# Patient Record
Sex: Female | Born: 1975 | Race: White | Hispanic: No | Marital: Single | Smoking: Never smoker
Health system: Southern US, Community
[De-identification: ages and names within clinical notes are randomized; demographics above are authoritative.]

## PROBLEM LIST (undated history)

## (undated) DIAGNOSIS — F419 Anxiety disorder, unspecified: Secondary | ICD-10-CM

## (undated) DIAGNOSIS — T7840XA Allergy, unspecified, initial encounter: Secondary | ICD-10-CM

## (undated) DIAGNOSIS — K602 Anal fissure, unspecified: Secondary | ICD-10-CM

## (undated) DIAGNOSIS — J45909 Unspecified asthma, uncomplicated: Secondary | ICD-10-CM

## (undated) DIAGNOSIS — F329 Major depressive disorder, single episode, unspecified: Secondary | ICD-10-CM

## (undated) DIAGNOSIS — F32A Depression, unspecified: Secondary | ICD-10-CM

## (undated) HISTORY — DX: Depression, unspecified: F32.A

## (undated) HISTORY — DX: Major depressive disorder, single episode, unspecified: F32.9

## (undated) HISTORY — PX: EYE SURGERY: SHX253

## (undated) HISTORY — PX: UPPER GASTROINTESTINAL ENDOSCOPY: SHX188

## (undated) HISTORY — PX: COLONOSCOPY: SHX174

## (undated) HISTORY — PX: NO PAST SURGERIES: SHX2092

## (undated) HISTORY — DX: Unspecified asthma, uncomplicated: J45.909

## (undated) HISTORY — PX: OTHER SURGICAL HISTORY: SHX169

## (undated) HISTORY — DX: Allergy, unspecified, initial encounter: T78.40XA

## (undated) HISTORY — DX: Anxiety disorder, unspecified: F41.9

## (undated) HISTORY — DX: Anal fissure, unspecified: K60.2

---

## 1998-04-15 ENCOUNTER — Other Ambulatory Visit: Admission: RE | Admit: 1998-04-15 | Discharge: 1998-04-15 | Payer: Self-pay | Admitting: Obstetrics & Gynecology

## 1998-05-05 ENCOUNTER — Other Ambulatory Visit: Admission: RE | Admit: 1998-05-05 | Discharge: 1998-05-05 | Payer: Self-pay | Admitting: Obstetrics & Gynecology

## 1998-12-14 ENCOUNTER — Other Ambulatory Visit: Admission: RE | Admit: 1998-12-14 | Discharge: 1998-12-14 | Payer: Self-pay | Admitting: Obstetrics & Gynecology

## 1999-05-10 ENCOUNTER — Other Ambulatory Visit: Admission: RE | Admit: 1999-05-10 | Discharge: 1999-05-10 | Payer: Self-pay | Admitting: Obstetrics & Gynecology

## 1999-12-26 ENCOUNTER — Other Ambulatory Visit: Admission: RE | Admit: 1999-12-26 | Discharge: 1999-12-26 | Payer: Self-pay | Admitting: Obstetrics & Gynecology

## 2000-06-22 ENCOUNTER — Inpatient Hospital Stay (HOSPITAL_COMMUNITY): Admission: AD | Admit: 2000-06-22 | Discharge: 2000-06-25 | Payer: Self-pay | Admitting: Obstetrics and Gynecology

## 2000-06-26 ENCOUNTER — Encounter: Admission: RE | Admit: 2000-06-26 | Discharge: 2000-07-26 | Payer: Self-pay | Admitting: Obstetrics and Gynecology

## 2000-08-22 ENCOUNTER — Other Ambulatory Visit: Admission: RE | Admit: 2000-08-22 | Discharge: 2000-08-22 | Payer: Self-pay | Admitting: Obstetrics & Gynecology

## 2000-08-26 ENCOUNTER — Encounter: Admission: RE | Admit: 2000-08-26 | Discharge: 2000-09-25 | Payer: Self-pay | Admitting: Obstetrics and Gynecology

## 2000-10-26 ENCOUNTER — Encounter: Admission: RE | Admit: 2000-10-26 | Discharge: 2000-11-25 | Payer: Self-pay | Admitting: Obstetrics and Gynecology

## 2000-11-26 ENCOUNTER — Encounter: Admission: RE | Admit: 2000-11-26 | Discharge: 2000-12-26 | Payer: Self-pay | Admitting: Obstetrics and Gynecology

## 2001-01-26 ENCOUNTER — Encounter: Admission: RE | Admit: 2001-01-26 | Discharge: 2001-02-25 | Payer: Self-pay | Admitting: Obstetrics and Gynecology

## 2001-03-28 ENCOUNTER — Encounter: Admission: RE | Admit: 2001-03-28 | Discharge: 2001-04-27 | Payer: Self-pay | Admitting: Obstetrics and Gynecology

## 2001-04-28 ENCOUNTER — Encounter: Admission: RE | Admit: 2001-04-28 | Discharge: 2001-05-28 | Payer: Self-pay | Admitting: Obstetrics and Gynecology

## 2001-06-26 ENCOUNTER — Encounter: Admission: RE | Admit: 2001-06-26 | Discharge: 2001-07-26 | Payer: Self-pay | Admitting: Obstetrics and Gynecology

## 2001-08-23 ENCOUNTER — Other Ambulatory Visit: Admission: RE | Admit: 2001-08-23 | Discharge: 2001-08-23 | Payer: Self-pay | Admitting: Obstetrics and Gynecology

## 2003-06-18 ENCOUNTER — Other Ambulatory Visit: Admission: RE | Admit: 2003-06-18 | Discharge: 2003-06-18 | Payer: Self-pay | Admitting: Obstetrics & Gynecology

## 2004-07-19 ENCOUNTER — Other Ambulatory Visit: Admission: RE | Admit: 2004-07-19 | Discharge: 2004-07-19 | Payer: Self-pay | Admitting: Obstetrics & Gynecology

## 2012-02-13 ENCOUNTER — Other Ambulatory Visit: Payer: Self-pay | Admitting: Obstetrics & Gynecology

## 2012-02-13 DIAGNOSIS — R928 Other abnormal and inconclusive findings on diagnostic imaging of breast: Secondary | ICD-10-CM

## 2012-02-21 ENCOUNTER — Ambulatory Visit
Admission: RE | Admit: 2012-02-21 | Discharge: 2012-02-21 | Disposition: A | Discharge status: Home / Self Care | Payer: BC Managed Care – PPO | Source: Ambulatory Visit | Attending: Obstetrics & Gynecology | Admitting: Obstetrics & Gynecology

## 2012-02-21 DIAGNOSIS — R928 Other abnormal and inconclusive findings on diagnostic imaging of breast: Secondary | ICD-10-CM

## 2012-05-24 ENCOUNTER — Emergency Department (HOSPITAL_BASED_OUTPATIENT_CLINIC_OR_DEPARTMENT_OTHER)
Admission: EM | Admit: 2012-05-24 | Discharge: 2012-05-24 | Disposition: A | Discharge status: Home / Self Care | Payer: BC Managed Care – PPO | Attending: Emergency Medicine | Admitting: Emergency Medicine

## 2012-05-24 ENCOUNTER — Encounter (HOSPITAL_BASED_OUTPATIENT_CLINIC_OR_DEPARTMENT_OTHER): Payer: Self-pay | Admitting: Emergency Medicine

## 2012-05-24 DIAGNOSIS — R197 Diarrhea, unspecified: Secondary | ICD-10-CM | POA: Insufficient documentation

## 2012-05-24 DIAGNOSIS — R42 Dizziness and giddiness: Secondary | ICD-10-CM | POA: Insufficient documentation

## 2012-05-24 DIAGNOSIS — A088 Other specified intestinal infections: Secondary | ICD-10-CM | POA: Insufficient documentation

## 2012-05-24 DIAGNOSIS — Z3202 Encounter for pregnancy test, result negative: Secondary | ICD-10-CM | POA: Insufficient documentation

## 2012-05-24 DIAGNOSIS — A084 Viral intestinal infection, unspecified: Secondary | ICD-10-CM

## 2012-05-24 LAB — URINALYSIS, ROUTINE W REFLEX MICROSCOPIC
Glucose, UA: NEGATIVE mg/dL
Hgb urine dipstick: NEGATIVE
Ketones, ur: 15 mg/dL — AB
Specific Gravity, Urine: 1.028 (ref 1.005–1.030)
pH: 5.5 (ref 5.0–8.0)

## 2012-05-24 LAB — PREGNANCY, URINE: Preg Test, Ur: NEGATIVE

## 2012-05-24 MED ORDER — ONDANSETRON 8 MG PO TBDP
8.0000 mg | ORAL_TABLET | Freq: Once | ORAL | Status: AC
  Administered 2012-05-24: 8 mg via ORAL
  Filled 2012-05-24: qty 1

## 2012-05-24 MED ORDER — ONDANSETRON 4 MG PO TBDP: 4.0000 mg | ORAL_TABLET | Freq: Four times a day (QID) | ORAL | Status: DC | PRN

## 2012-05-24 MED ORDER — LOPERAMIDE HCL 2 MG PO CAPS
4.0000 mg | ORAL_CAPSULE | Freq: Once | ORAL | Status: AC
  Administered 2012-05-24: 4 mg via ORAL
  Filled 2012-05-24: qty 2

## 2012-05-24 MED ORDER — IBUPROFEN 800 MG PO TABS
800.0000 mg | ORAL_TABLET | Freq: Once | ORAL | Status: DC
  Filled 2012-05-24: qty 1

## 2012-05-24 NOTE — ED Notes (Signed)
Pt c/o n/v/d since 9 pm

## 2012-05-24 NOTE — ED Notes (Signed)
MD at bedside. 

## 2012-05-24 NOTE — ED Provider Notes (Signed)
History     CSN: [id number]  Arrival date & time 05/24/12  0138   First MD Initiated Contact with Patient 05/24/12 0149      Chief Complaint  Patient presents with  . Emesis  . Diarrhea    (Consider location/radiation/quality/duration/timing/severity/associated sxs/prior treatment) HPI DIAMONIQUE RUEDAS is a 37 y.o. female presents with extensive nausea vomiting and foul-smelling diarrhea that started at 9:00 earlier last evening. 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 She denies any fevers, chills, chest pain, shortness of breath. She said some mild orthostatic dizziness. She also endorses some abdominal cramping prior to vomiting or prior to diarrhea than that spontaneously resolves. She says her symptoms have been severe, constant, she's not taken anything for them, and no other alleviating or exacerbating factors no other associated symptoms. Patient is currently just completed her menstrual cycle. Denies any dysuria, frequency     History reviewed. No pertinent past medical history.  History reviewed. No pertinent past surgical history.  No family history on file.  History  Substance Use Topics  . Smoking status: Never Smoker   . Smokeless tobacco: Not on file  . Alcohol Use: No    OB History   Grav Para Term Preterm Abortions TAB SAB Ect Mult Living                  Review of Systems At least 10pt or greater review of systems completed and are negative except where specified in the HPI.  Allergies  Review of patient's allergies indicates no known allergies.  Home Medications   Current Outpatient Rx  Name  Route  Sig  Dispense  Refill  . FLUoxetine (PROZAC) 20 MG capsule   Oral   Take 20 mg by mouth daily.           BP 113/69  Pulse 87  Temp(Src) 97.9 F (36.6 C) (Oral)  Resp 18  Ht 5\' 8"  (1.727 m)  Wt 124 lb (56.246 kg)  BMI 18.86 kg/m2  SpO2 100%  Physical Exam  Nursing notes reviewed.   Electronic medical record reviewed. VITAL SIGNS:   Filed Vitals:   05/24/12 0146  BP: 113/69  Pulse: 87  Temp: 97.9 F (36.6 C)  TempSrc: Oral  Resp: 18  Height: 5\' 8"  (1.727 m)  Weight: 124 lb (56.246 kg)  SpO2: 100%   CONSTITUTIONAL: Awake, oriented, appears non-toxic HENT: Atraumatic, normocephalic, oral mucosa pink and moist, airway patent. Nares patent without drainage. External ears normal. EYES: Conjunctiva clear, EOMI, PERRLA NECK: Trachea midline, non-tender, supple CARDIOVASCULAR: Normal heart rate, Normal rhythm, No murmurs, rubs, gallops PULMONARY/CHEST: Clear to auscultation, no rhonchi, wheezes, or rales. Symmetrical breath sounds. Non-tender. ABDOMINAL: Non-distended, soft, non-tender - no rebound or guarding.  BS normal. NEUROLOGIC: Non-focal, moving all four extremities, no gross sensory or motor deficits. EXTREMITIES: No clubbing, cyanosis, or edema SKIN: Warm, Dry, No erythema, No rash  ED Course  Procedures (including critical care time)  Labs Reviewed  URINALYSIS, ROUTINE W REFLEX MICROSCOPIC - Abnormal; Notable for the following:    Color, Urine AMBER (*)    Bilirubin Urine SMALL (*)    Ketones, ur 15 (*)    All other components within normal limits  PREGNANCY, URINE   No results found.   1. Viral gastroenteritis       MDM  ALJEAN HORIUCHI is a 37 y.o. female presents with likely viral gastroenteritis and some mild dehydration. Patient is not amenable  to keep fluids down, give her antiemetics, loperamide to reduce fluid losses.  Encouraged patient to rehydrate orally, vital signs are stable within normal limits - do not think IV fluids are indicated at this time.  She has no focal abdominal pain, do not think she's got an acute intra-abdominal emergency at this time, doubt appendicitis, she denies any vaginal discharge, doubt ovarian pathology such as TOA or ovarian torsion. She is pain-free until she has an episode of vomiting or  diarrhea  Patient observe for time, she's feeling better after medication, we'll discharged him stable and good condition with antiemetics, loperamide and acetaminophen versus ibuprofen as needed for discomfort. Patient will return to work in 2 days.  I explained the diagnosis and have given explicit precautions to return to the ER including focal abdominal pain or any other new or worsening symptoms. The patient understands and accepts the medical plan as it's been dictated and I have answered their questions. Discharge instructions concerning home care and prescriptions have been given.  The patient is STABLE and is discharged to home in good condition.           Jones Skene, MD 05/24/12 [phone]

## 2013-01-06 ENCOUNTER — Other Ambulatory Visit (HOSPITAL_COMMUNITY): Payer: Self-pay | Admitting: Gynecology

## 2013-01-06 DIAGNOSIS — Z3141 Encounter for fertility testing: Secondary | ICD-10-CM

## 2013-01-14 ENCOUNTER — Other Ambulatory Visit (HOSPITAL_COMMUNITY): Payer: BC Managed Care – PPO

## 2013-02-11 ENCOUNTER — Encounter (INDEPENDENT_AMBULATORY_CARE_PROVIDER_SITE_OTHER): Payer: Self-pay

## 2013-02-11 ENCOUNTER — Ambulatory Visit (HOSPITAL_COMMUNITY)
Admission: RE | Admit: 2013-02-11 | Discharge: 2013-02-11 | Disposition: A | Discharge status: Home / Self Care | Payer: BC Managed Care – PPO | Source: Ambulatory Visit | Attending: Gynecology | Admitting: Gynecology

## 2013-02-11 DIAGNOSIS — N979 Female infertility, unspecified: Secondary | ICD-10-CM | POA: Insufficient documentation

## 2013-02-11 DIAGNOSIS — Z3141 Encounter for fertility testing: Secondary | ICD-10-CM

## 2013-02-11 MED ORDER — IOHEXOL 300 MG/ML  SOLN
10.0000 mL | Freq: Once | INTRAMUSCULAR | Status: AC | PRN
  Administered 2013-02-11: 10 mL

## 2015-01-01 ENCOUNTER — Ambulatory Visit (INDEPENDENT_AMBULATORY_CARE_PROVIDER_SITE_OTHER): Payer: BLUE CROSS/BLUE SHIELD | Admitting: Internal Medicine

## 2015-01-01 ENCOUNTER — Encounter: Payer: Self-pay | Admitting: Internal Medicine

## 2015-01-01 VITALS — BP 90/58 | HR 77 | Temp 98.2°F | Resp 14 | Ht 69.5 in | Wt 120.8 lb

## 2015-01-01 DIAGNOSIS — T781XXA Other adverse food reactions, not elsewhere classified, initial encounter: Secondary | ICD-10-CM | POA: Insufficient documentation

## 2015-01-01 DIAGNOSIS — R0602 Shortness of breath: Secondary | ICD-10-CM | POA: Insufficient documentation

## 2015-01-01 DIAGNOSIS — J301 Allergic rhinitis due to pollen: Secondary | ICD-10-CM | POA: Diagnosis not present

## 2015-01-01 MED ORDER — FLUNISOLIDE 25 MCG/ACT (0.025%) NA SOLN: 2.0000 | Freq: Two times a day (BID) | NASAL | Status: DC

## 2015-01-01 MED ORDER — ALBUTEROL SULFATE (2.5 MG/3ML) 0.083% IN NEBU
2.5000 mg | INHALATION_SOLUTION | Freq: Once | RESPIRATORY_TRACT | Status: AC
  Administered 2015-01-01: 2.5 mg via RESPIRATORY_TRACT

## 2015-01-01 MED ORDER — ALBUTEROL SULFATE 108 (90 BASE) MCG/ACT IN AEPB: 2.0000 | INHALATION_SPRAY | RESPIRATORY_TRACT | Status: DC | PRN

## 2015-01-01 NOTE — Assessment & Plan Note (Addendum)
   Allergic to grass, tree, cat, dog, cockroach, dust mite. Handouts given.  Start cetirizine 1 tablet daily.  If no improvement may add flunisolide 1 spray each nostril twice a day.  Consider allergen immunotherapy. She would be a good candidate.

## 2015-01-01 NOTE — Assessment & Plan Note (Signed)
   Handout given. Avoid the foods that cause her mouth to itch and burn.

## 2015-01-01 NOTE — Assessment & Plan Note (Addendum)
   Start pro-air repiclick as needed.  Inhaler technique reviewed.

## 2015-01-01 NOTE — Progress Notes (Signed)
01/01/2015  Alexandra Barrett [birth year] [id number]  Referring provider: Freddy FinnerW Ronald Neal, MD [address] [place], [place] [id number]  Chief Complaint: Facial Swelling and Nasal Congestion   Alexandra Barrett is a 39 y.o. female who is being seen today for evaluation of possible food allergy, rhinitis, shortness of breath.  HPI Comments: Possible food allergy: Patient has been having increasing problems as she has gotten older. She has eaten banana nut muffins containing walnuts and immediately developed a burning sensation in her mouth that resolved fairly quickly. She did not have any associated respiratory GI or cutaneous involvement. She had tolerated tree nuts in the past without any problems. Large amounts of ice cream also cause nausea and bloating but she can tolerate cheese and yogurt without problems. She has tried lactase which initially helped but now does not seem to be working.  Rhinitis: Patient has symptoms mainly in the spring and fall. She has not had repeated sinus infections requiring antibiotics. She has tried Benadryl which did help but caused excessive sedation so she stopped. She has also tried Claritin but is not sure if it helped.  Exercise-induced bronchospasm: Patient has symptoms with strenuous exercise and also with exposure to hot air. She required prednisone on one occasion over 10 years ago. At that time a chest x-ray demonstrated inflammation. She has never had pneumonia; she has not had any emergency room visits or hospitalizations. She denies nocturnal awakening due to shortness of breath and persistent coughing. She has albuterol which she requires rarely.   PMH:  Past Medical History  Diagnosis Date  . Depression   . Anxiety      PSH: History reviewed. No pertinent past surgical history.  Environmental/Social history: She lives in a townhouse that is [address] years of age, there is a feather pillow and comforter, wood flooring, central air  conditioning and heating, cat, no basement in the home, there are family members who smoke outdoors. She is a nonsmoker. She works as a Social workernanny.  Medications:  Current outpatient prescriptions:  .  FLUoxetine (PROZAC) 20 MG capsule, Take 20 mg by mouth daily., Disp: , Rfl:  .  Lactase (DAIRY DIGEST EXTRA PO), Take by mouth 3 (three) times daily., Disp: , Rfl:  .  Albuterol Sulfate (PROAIR RESPICLICK) 108 (90 BASE) MCG/ACT AEPB, Inhale 2 puffs into the lungs every 4 (four) hours as needed., Disp: 1 each, Rfl: 2 .  flunisolide (NASALIDE) 25 MCG/ACT (0.025%) SOLN, Place 2 sprays into the nose 2 (two) times daily., Disp: 1 Bottle, Rfl: 5  FH: Family History  Problem Relation Age of Onset  . Emphysema Maternal Grandfather   . COPD Maternal Grandfather   . Asthma Mother   . Allergic rhinitis Mother     ROS: Per HPI unless specifically indicated below Review of Systems  Constitutional: Negative for fever, chills, appetite change and unexpected weight change.  HENT: Positive for congestion, ear pain, postnasal drip, rhinorrhea, sinus pressure and sneezing. Negative for sore throat.   Eyes: Positive for itching. Negative for pain.  Respiratory: Positive for shortness of breath. Negative for cough, chest tightness and wheezing.   Cardiovascular: Negative for chest pain and leg swelling.  Gastrointestinal: Positive for nausea, constipation and abdominal distention. Negative for vomiting and diarrhea.  Genitourinary: Negative for difficulty urinating.  Musculoskeletal: Negative for joint swelling and arthralgias.  Skin: Negative for rash.  Allergic/Immunologic: Positive for environmental allergies and food allergies. Negative for immunocompromised state.       Wasp  sting - no problems No latex allergy   Neurological: Negative for seizures.    Drug Allergies:  No Known Allergies  Physical Exam: BP 90/58 mmHg  Pulse 77  Temp(Src) 98.2 F (36.8 C) (Oral)  Resp 14  Ht 5' 9.5" (1.765 m)   Wt 120 lb 12.8 oz (54.795 kg)  BMI 17.59 kg/m2  Physical Exam  Constitutional: She appears well-developed and well-nourished. No distress.  HENT:  Right Ear: External ear normal.  Left Ear: External ear normal.  Nose: Nose normal.  Mouth/Throat: Oropharynx is clear and moist.  Eyes: Conjunctivae are normal. Right eye exhibits no discharge. Left eye exhibits no discharge.  Cardiovascular: Normal rate, regular rhythm and normal heart sounds.   No murmur heard. Pulmonary/Chest: Effort normal and breath sounds normal. No respiratory distress. She has no wheezes. She has no rales.  Abdominal: Soft. Bowel sounds are normal.  Musculoskeletal: She exhibits no edema.  Lymphadenopathy:    She has no cervical adenopathy.  Neurological: She is alert.  Skin: No rash noted.  Vitals reviewed.   Diagnostics:   Spirometry: FEV1 75 %, FEV1/FVC  79 % Partially reversible following bronchodilators Spirometry is in the abnormal range - Mild restriction  Aeroallergen skin testing was performed and was positive for: Grass, Tree, Mite, Cat, Dog, Cockroach and Mouse with a good histamine control.   Food allergy skin testing was performed and was negative with a good histamine control.   Skin tests were interpreted by me, transferred into EPIC by CMA, reviewed and accepted by me into EPIC.  Assessment and Plan:  Allergic rhinitis due to pollen  Allergic to grass, tree, cat, dog, cockroach, dust mite. Handouts given.  Start cetirizine 1 tablet daily.  If no improvement may add flunisolide 1 spray each nostril twice a day.  Consider allergen immunotherapy. She would be a good candidate.  Shortness of breath  Start pro-air repiclick as needed.  Inhaler technique reviewed.  Oral allergy syndrome  Handout given. Avoid the foods that cause her mouth to itch and burn.    Return in about 4 weeks (around 01/29/2015).  Thank you for the opportunity to care for this patient.  Please do not  hesitate to contact me with questions.  [facility] [address] [place], [place] [address] [phone]

## 2015-01-01 NOTE — Patient Instructions (Addendum)
Allergic rhinitis due to pollen  Allergic to grass, tree, cat, dog, cockroach, dust mite. Handouts given.  Start cetirizine 1 tablet daily.  If no improvement may add flunisolide 1 spray each nostril twice a day.  Consider allergen immunotherapy. She would be a good candidate.  Shortness of breath  Start pro-air repiclick as needed.  Inhaler technique reviewed.  Oral allergy syndrome  Handout given. Avoid the foods that cause her mouth to itch and burn.             How to Use an Inhaler Proper inhaler technique is very important. Good technique ensures that the medicine reaches the lungs. Poor technique results in depositing the medicine on the tongue and back of the throat rather than in the airways. If you do not use the inhaler with good technique, the medicine will not help you. STEPS TO FOLLOW IF USING AN INHALER WITHOUT AN EXTENSION TUBE 8. Remove the cap from the inhaler. 9. If you are using the inhaler for the first time, you will need to prime it. Shake the inhaler for 5 seconds and release four puffs into the air, away from your face. Ask your health care provider or pharmacist if you have questions about priming your inhaler. 10. Shake the inhaler for 5 seconds before each breath in (inhalation). 11. Position the inhaler so that the top of the canister faces up. 12. Put your index finger on the top of the medicine canister. Your thumb supports the bottom of the inhaler. 13. Open your mouth. 14. Either place the inhaler between your teeth and place your lips tightly around the mouthpiece, or hold the inhaler 1-2 inches away from your open mouth. If you are unsure of which technique to use, ask your health care provider. 15. Breathe out (exhale) normally and as completely as possible. 16. Press the canister down with your index finger to release the medicine. 17. At the same time as the canister is pressed, inhale deeply and slowly until your lungs are completely  filled. This should take 4-6 seconds. Keep your tongue down. 18. Hold the medicine in your lungs for 5-10 seconds (10 seconds is best). This helps the medicine get into the small airways of your lungs. 19. Breathe out slowly, through pursed lips. Whistling is an example of pursed lips. 20. Wait at least 15-30 seconds between puffs. Continue with the above steps until you have taken the number of puffs your health care provider has ordered. Do not use the inhaler more than your health care provider tells you. 21. Replace the cap on the inhaler. 22. Follow the directions from your health care provider or the inhaler insert for cleaning the inhaler. STEPS TO FOLLOW IF USING AN INHALER WITH AN EXTENSION (SPACER) 1. Remove the cap from the inhaler. 2. If you are using the inhaler for the first time, you will need to prime it. Shake the inhaler for 5 seconds and release four puffs into the air, away from your face. Ask your health care provider or pharmacist if you have questions about priming your inhaler. 3. Shake the inhaler for 5 seconds before each breath in (inhalation). 4. Place the open end of the spacer onto the mouthpiece of the inhaler. 5. Position the inhaler so that the top of the canister faces up and the spacer mouthpiece faces you. 6. Put your index finger on the top of the medicine canister. Your thumb supports the bottom of the inhaler and the spacer. 7. Breathe out (exhale)  normally and as completely as possible. 8. Immediately after exhaling, place the spacer between your teeth and into your mouth. Close your lips tightly around the spacer. 9. Press the canister down with your index finger to release the medicine. 10. At the same time as the canister is pressed, inhale deeply and slowly until your lungs are completely filled. This should take 4-6 seconds. Keep your tongue down and out of the way. 11. Hold the medicine in your lungs for 5-10 seconds (10 seconds is best). This helps the  medicine get into the small airways of your lungs. Exhale. 12. Repeat inhaling deeply through the spacer mouthpiece. Again hold that breath for up to 10 seconds (10 seconds is best). Exhale slowly. If it is difficult to take this second deep breath through the spacer, breathe normally several times through the spacer. Remove the spacer from your mouth. 13. Wait at least 15-30 seconds between puffs. Continue with the above steps until you have taken the number of puffs your health care provider has ordered. Do not use the inhaler more than your health care provider tells you. 14. Remove the spacer from the inhaler, and place the cap on the inhaler. 15. Follow the directions from your health care provider or the inhaler insert for cleaning the inhaler and spacer. If you are using different kinds of inhalers, use your quick relief medicine to open the airways 10-15 minutes before using a steroid if instructed to do so by your health care provider. If you are unsure which inhalers to use and the order of using them, ask your health care provider, nurse, or respiratory therapist. If you are using a steroid inhaler, always rinse your mouth with water after your last puff, then gargle and spit out the water. Do not swallow the water. AVOID:  Inhaling before or after starting the spray of medicine. It takes practice to coordinate your breathing with triggering the spray.  Inhaling through the nose (rather than the mouth) when triggering the spray. HOW TO DETERMINE IF YOUR INHALER IS FULL OR NEARLY EMPTY You cannot know when an inhaler is empty by shaking it. A few inhalers are now being made with dose counters. Ask your health care provider for a prescription that has a dose counter if you feel you need that extra help. If your inhaler does not have a counter, ask your health care provider to help you determine the date you need to refill your inhaler. Write the refill date on a calendar or your inhaler  canister. Refill your inhaler 7-10 days before it runs out. Be sure to keep an adequate supply of medicine. This includes making sure it is not expired, and that you have a spare inhaler.  SEEK MEDICAL CARE IF:   Your symptoms are only partially relieved with your inhaler.  You are having trouble using your inhaler.  You have some increase in phlegm. SEEK IMMEDIATE MEDICAL CARE IF:   You feel little or no relief with your inhalers. You are still wheezing and are feeling shortness of breath or tightness in your chest or both.  You have dizziness, headaches, or a fast heart rate.  You have chills, fever, or night sweats.  You have a noticeable increase in phlegm production, or there is blood in the phlegm. MAKE SURE YOU:   Understand these instructions.  Will watch your condition.  Will get help right away if you are not doing well or get worse.   This information is not intended  to replace advice given to you by your health care provider. Make sure you discuss any questions you have with your health care provider.   Document Released: 02/18/2000 Document Revised: 12/11/2012 Document Reviewed: 09/19/2012 Elsevier Interactive Patient Education [employer]

## 2015-03-29 ENCOUNTER — Telehealth: Payer: Self-pay | Admitting: Internal Medicine

## 2015-03-29 NOTE — Telephone Encounter (Signed)
Called pt to explain that the entire amount went to her deductible - she will pay $1000 Friday

## 2015-03-29 NOTE — Telephone Encounter (Signed)
Patient received [facility] bill but said that her insurance company covered expenses so she is wondering if she is still responsible for the bill. Please call back.

## 2017-04-13 ENCOUNTER — Other Ambulatory Visit: Payer: Self-pay | Admitting: Obstetrics & Gynecology

## 2017-04-13 DIAGNOSIS — R928 Other abnormal and inconclusive findings on diagnostic imaging of breast: Secondary | ICD-10-CM

## 2017-04-18 ENCOUNTER — Ambulatory Visit
Admission: RE | Admit: 2017-04-18 | Discharge: 2017-04-18 | Disposition: A | Discharge status: Home / Self Care | Payer: BLUE CROSS/BLUE SHIELD | Source: Ambulatory Visit | Attending: Obstetrics & Gynecology | Admitting: Obstetrics & Gynecology

## 2017-04-18 DIAGNOSIS — R928 Other abnormal and inconclusive findings on diagnostic imaging of breast: Secondary | ICD-10-CM

## 2019-06-09 IMAGING — MG DIGITAL DIAGNOSTIC UNILATERAL RIGHT MAMMOGRAM WITH TOMO AND CAD
8 series · 8 of 24 positions shown · non-contrast
Comparison: Previous exams including recent screening mammogram
dated 04/12/2017.

CLINICAL DATA: Patient returns today to evaluate a possible right
breast asymmetry questioned on recent screening mammogram.

EXAM:
DIGITAL DIAGNOSTIC RIGHT MAMMOGRAM WITH CAD AND TOMO
ULTRASOUND RIGHT BREAST

[R ML synth-2D]
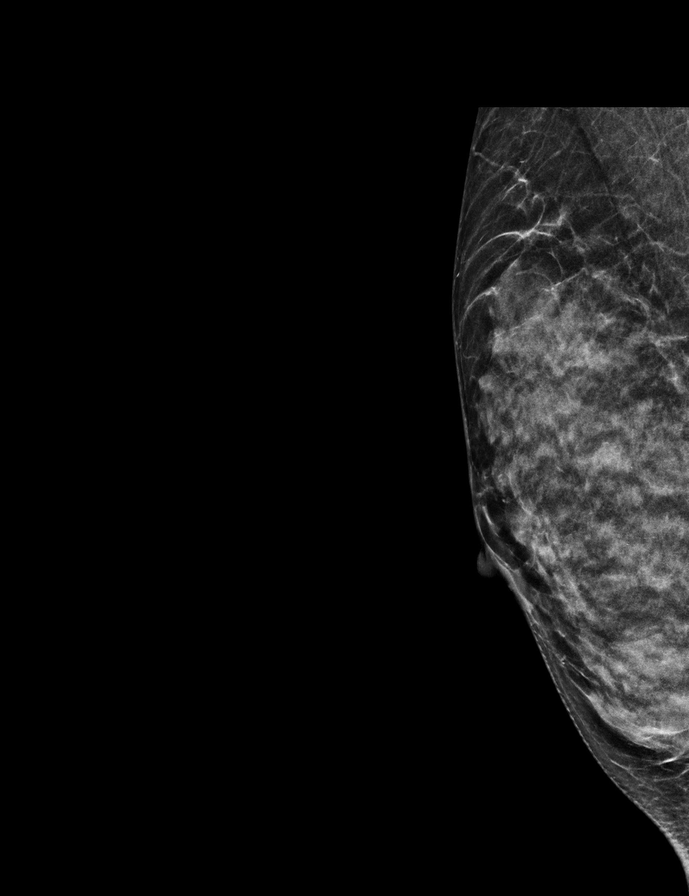

[R MLO synth-2D]
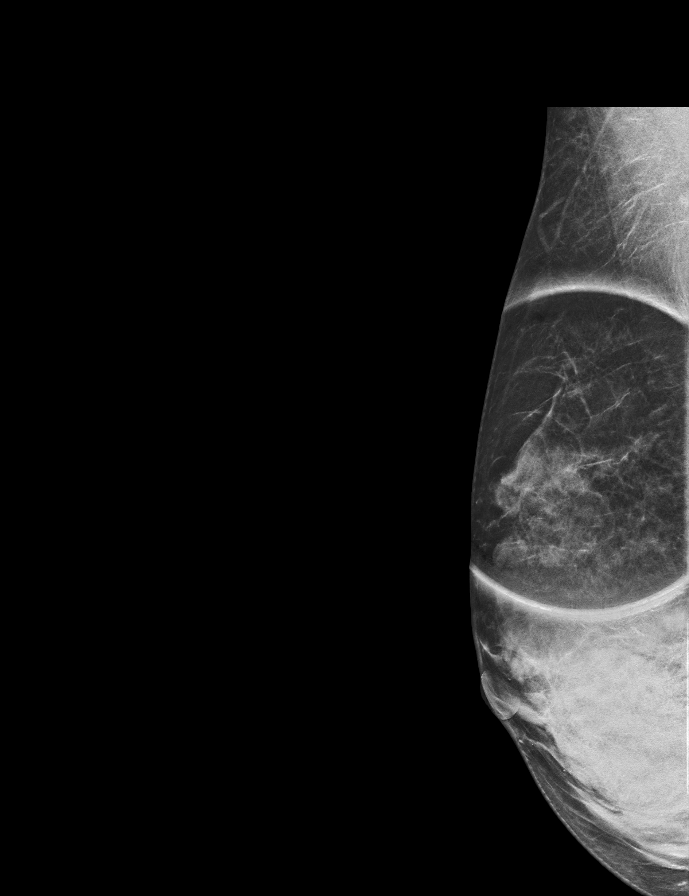

[R CC synth-2D (1 of 2)]
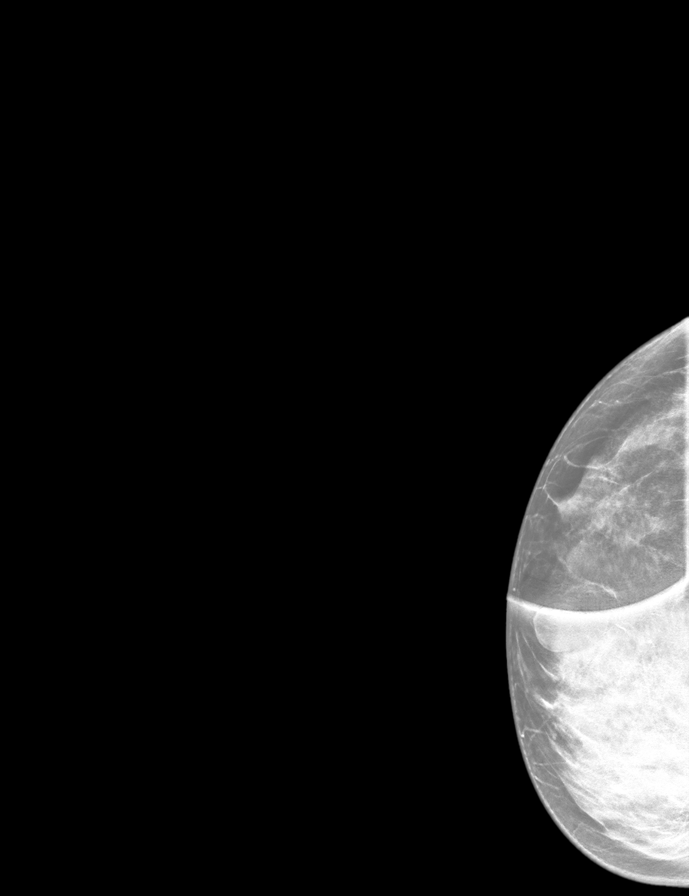

[R CC synth-2D (2 of 2)]
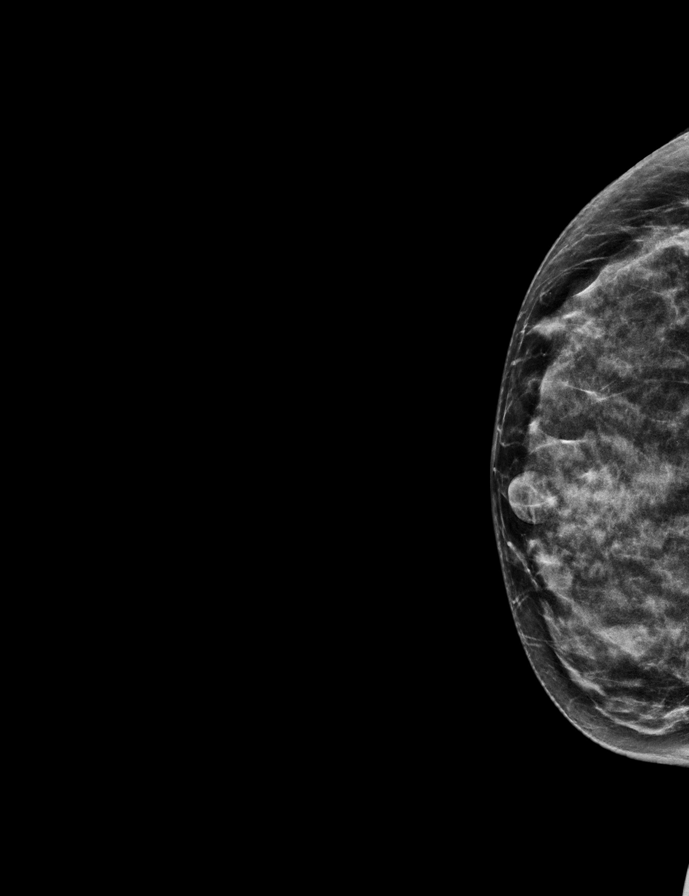

[R CC tomo (1 of 2) · tomo slice 22/43.0]
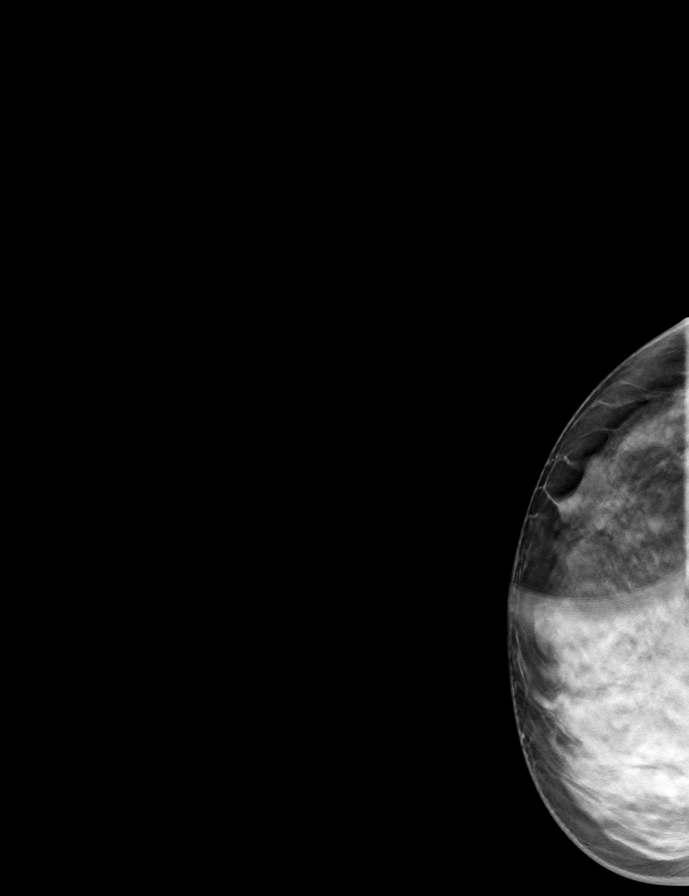

[R ML tomo · tomo slice 23/45.0]
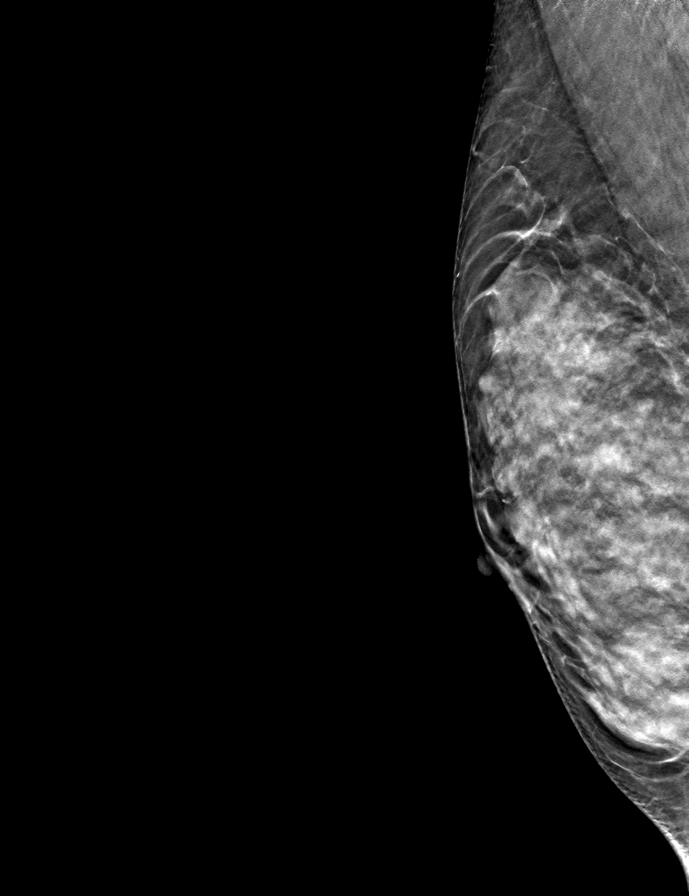

[R CC tomo (2 of 2) · tomo slice 25/48.0]
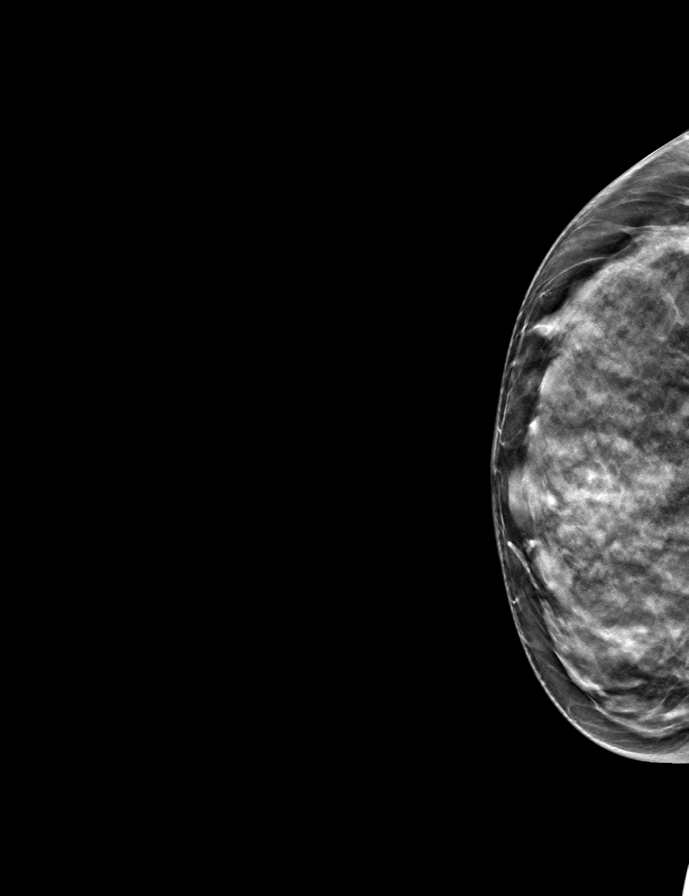

[R MLO tomo · tomo slice 18/35.0]
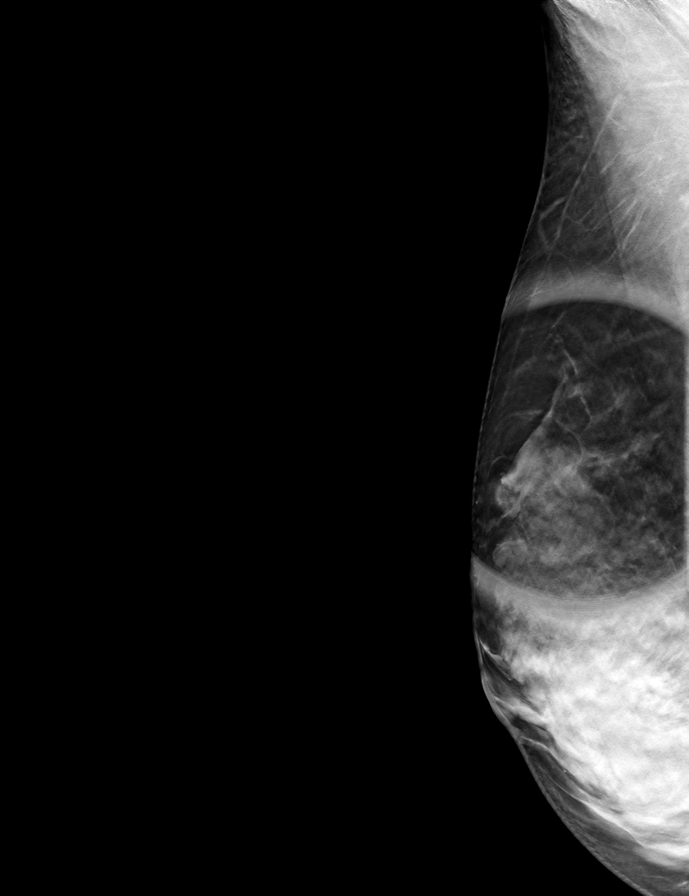

[8 of 24 positions shown; findings below may reference images not displayed]

ACR Breast Density Category c: The breast tissue is heterogeneously
dense, which may obscure small masses.
FINDINGS: On today's additional diagnostic views, including spot compression
with 3D tomosynthesis, the questioned asymmetry within the
upper-outer quadrant of the right breast is most suggestive of
superimposition of normal dense fibroglandular tissues. There are no
dominant masses, suspicious calcifications or secondary signs of
malignancy identified within the right breast on today's exam.

Mammographic images were processed with CAD.

Targeted ultrasound is performed, showing multiple benign cysts
within the upper-outer quadrant of the right breast, 10-11 o'clock
axes, largest measuring 5 mm, most likely incidental findings. There
is no suspicious solid or cystic mass identified within the
upper-outer quadrant of the right breast.
IMPRESSION: No evidence of malignancy. Multiple benign cysts within the upper
outer quadrant of the right breast.

Patient may return to routine annual bilateral screening mammogram
schedule.

RECOMMENDATION:
Screening mammogram in one year.(Code:[id number])

I have discussed the findings and recommendations with the patient.
Results were also provided in writing at the conclusion of the
visit. If applicable, a reminder letter will be sent to the patient
regarding the next appointment.

BI-RADS CATEGORY  2: Benign.

## 2020-11-22 LAB — HM MAMMOGRAPHY

## 2020-12-13 LAB — HM PAP SMEAR: HM Pap smear: NEGATIVE

## 2021-03-22 DIAGNOSIS — Z20822 Contact with and (suspected) exposure to covid-19: Secondary | ICD-10-CM | POA: Diagnosis not present

## 2021-03-22 DIAGNOSIS — J029 Acute pharyngitis, unspecified: Secondary | ICD-10-CM | POA: Diagnosis not present

## 2021-04-18 DIAGNOSIS — J069 Acute upper respiratory infection, unspecified: Secondary | ICD-10-CM | POA: Diagnosis not present

## 2021-04-18 DIAGNOSIS — N760 Acute vaginitis: Secondary | ICD-10-CM | POA: Diagnosis not present

## 2021-04-18 DIAGNOSIS — Z20822 Contact with and (suspected) exposure to covid-19: Secondary | ICD-10-CM | POA: Diagnosis not present

## 2021-04-20 DIAGNOSIS — J069 Acute upper respiratory infection, unspecified: Secondary | ICD-10-CM | POA: Diagnosis not present

## 2021-04-20 DIAGNOSIS — R509 Fever, unspecified: Secondary | ICD-10-CM | POA: Diagnosis not present

## 2021-04-20 DIAGNOSIS — J029 Acute pharyngitis, unspecified: Secondary | ICD-10-CM | POA: Diagnosis not present

## 2021-04-20 DIAGNOSIS — Z20822 Contact with and (suspected) exposure to covid-19: Secondary | ICD-10-CM | POA: Diagnosis not present

## 2021-04-20 DIAGNOSIS — R0602 Shortness of breath: Secondary | ICD-10-CM | POA: Diagnosis not present

## 2021-04-20 DIAGNOSIS — R059 Cough, unspecified: Secondary | ICD-10-CM | POA: Diagnosis not present

## 2021-06-20 ENCOUNTER — Encounter: Payer: Self-pay | Admitting: Gastroenterology

## 2021-07-07 ENCOUNTER — Encounter: Payer: Self-pay | Admitting: Family Medicine

## 2021-07-07 ENCOUNTER — Ambulatory Visit (INDEPENDENT_AMBULATORY_CARE_PROVIDER_SITE_OTHER): Payer: 59 | Admitting: Family Medicine

## 2021-07-07 VITALS — BP 98/64 | HR 84 | Ht 68.0 in | Wt 130.0 lb

## 2021-07-07 DIAGNOSIS — R5382 Chronic fatigue, unspecified: Secondary | ICD-10-CM | POA: Diagnosis not present

## 2021-07-07 DIAGNOSIS — R519 Headache, unspecified: Secondary | ICD-10-CM | POA: Diagnosis not present

## 2021-07-07 DIAGNOSIS — Z13220 Encounter for screening for lipoid disorders: Secondary | ICD-10-CM | POA: Diagnosis not present

## 2021-07-07 DIAGNOSIS — Z136 Encounter for screening for cardiovascular disorders: Secondary | ICD-10-CM

## 2021-07-07 DIAGNOSIS — E538 Deficiency of other specified B group vitamins: Secondary | ICD-10-CM | POA: Diagnosis not present

## 2021-07-07 LAB — COMPREHENSIVE METABOLIC PANEL
ALT: 17 U/L (ref 0–35)
AST: 18 U/L (ref 0–37)
Albumin: 4.6 g/dL (ref 3.5–5.2)
Alkaline Phosphatase: 43 U/L (ref 39–117)
BUN: 12 mg/dL (ref 6–23)
CO2: 29 mEq/L (ref 19–32)
Calcium: 9.3 mg/dL (ref 8.4–10.5)
Chloride: 101 mEq/L (ref 96–112)
Creatinine, Ser: 0.72 mg/dL (ref 0.40–1.20)
GFR: 100.9 mL/min (ref >60.00)
Glucose, Bld: 79 mg/dL (ref 70–99)
Potassium: 4.6 mEq/L (ref 3.5–5.1)
Sodium: 137 mEq/L (ref 135–145)
Total Bilirubin: 0.5 mg/dL (ref 0.2–1.2)
Total Protein: 7.1 g/dL (ref 6.0–8.3)

## 2021-07-07 LAB — CBC
HCT: 38.9 % (ref 36.0–46.0)
Hemoglobin: 12.9 g/dL (ref 12.0–15.0)
MCHC: 33.1 g/dL (ref 30.0–36.0)
MCV: 95 fl (ref 78.0–100.0)
Platelets: 394 10*3/uL (ref 150.0–400.0)
RBC: 4.1 Mil/uL (ref 3.87–5.11)
RDW: 12.9 % (ref 11.5–15.5)
WBC: 6.4 10*3/uL (ref 4.0–10.5)

## 2021-07-07 LAB — LIPID PANEL
Cholesterol: 160 mg/dL (ref 0–200)
HDL: 69.7 mg/dL (ref >39.00)
LDL Cholesterol: 80 mg/dL (ref 0–99)
NonHDL: 90.69
Total CHOL/HDL Ratio: 2
Triglycerides: 54 mg/dL (ref 0.0–149.0)
VLDL: 10.8 mg/dL (ref 0.0–40.0)

## 2021-07-07 LAB — VITAMIN B12: Vitamin B-12: 209 pg/mL — ABNORMAL LOW (ref 211–911)

## 2021-07-07 LAB — IBC PANEL
Iron: 96 ug/dL (ref 42–145)
Saturation Ratios: 18.5 % — ABNORMAL LOW (ref 20.0–50.0)
TIBC: 518 ug/dL — ABNORMAL HIGH (ref 250.0–450.0)
Transferrin: 370 mg/dL — ABNORMAL HIGH (ref 212.0–360.0)

## 2021-07-07 LAB — TSH: TSH: 1.66 u[IU]/mL (ref 0.35–5.50)

## 2021-07-07 NOTE — Progress Notes (Signed)
Fatigue-wants labs ?Constipation-has GI appt on 5/10 ?

## 2021-07-07 NOTE — Patient Instructions (Signed)
Thank you for choosing [facility] for your Primary Care needs. I am excited for the opportunity to partner with you to meet your health care goals. It was a pleasure meeting you today! ? ? ?Information on diet, exercise, and health maintenance recommendations are listed below. This is information to help you be sure you are on track for optimal health and monitoring.  ? ?Please look over this and let us know if you have any questions or if you have completed any of the health maintenance outside of [place] so that we can be sure your records are up to date.  ?___________________________________________________________ ? ?MyChart:  ?For all urgent or time sensitive needs we ask that you please call the office to avoid delays. Our number is [phone]. ?MyChart is not constantly monitored and due to the large volume of messages a day, replies may take up to 72 business hours. ? ?MyChart Policy: ?MyChart allows for you to see your visit notes, after visit summary, provider recommendations, lab and tests results, make an appointment, request refills, and contact your provider or the office for non-urgent questions or concerns. Providers are seeing patients during normal business hours and do not have built in time to review MyChart messages.  ?We ask that you allow a minimum of 3 business days for responses to MyChart messages. For this reason, please do not send urgent requests through MyChart. Please call the office at [phone]. ?New and ongoing conditions may require a visit. We have virtual and in-person visits available for your convenience.  ?Complex MyChart concerns may require a visit. Your provider may request you schedule a virtual or in-person visit to ensure we are providing the best care possible. ?MyChart messages sent after 11:00 AM on Friday will not be received by the provider until Monday morning.  ?  ?Lab and Test Results: ?You will receive your lab and  test results on MyChart as soon as they are completed and results have been sent by the lab or testing facility. Due to this service, you will receive your results BEFORE your provider.  ?I review lab and test results each morning prior to seeing patients. Some results require collaboration with other providers to ensure you are receiving the most appropriate care. For this reason, we ask that you please allow a minimum of 3-5 business days from the time that ALL results have been received for your provider to receive and review lab and test results and contact you about these.  ?Most lab and test result comments from the provider will be sent through MyChart. Your provider may recommend changes to the plan of care, follow-up visits, repeat testing, ask questions, or request an office visit to discuss these results. You may reply directly to this message or call the office to provide information for the provider or set up an appointment. ?In some instances, you will be called with test results and recommendations. Please let us know if this is preferred and we will make note of this in your chart to provide this for you.    ?If you have not heard a response to your lab or test results in 5 business days from all results returning to MyChart, please call the office to let us know. We ask that you please avoid calling prior to this time unless there is an emergent concern. Due to high call volumes, this can delay the resulting process. ? ?After Hours: ?For all non-emergency after hours needs,   please call the office at [phone] and select the option to reach the on-call  service. On-call services are shared between multiple [facility] offices and therefore it will not be possible to speak directly with your provider. On-call providers may provide medical advice and recommendations, but are unable to provide refills for maintenance medications.  ?For all emergency or urgent medical needs after normal business  hours, we recommend that you seek care at the closest Urgent Care or Emergency Department to ensure appropriate treatment in a timely manner.  ?[facility] has a 24 hour emergency room located on the ground floor for your convenience.  ? ?Urgent Concerns During the Business Day ?Providers are seeing patients from 8AM to 5PM with a busy schedule and are most often not able to respond to non-urgent calls until the end of the day or the next business day. ?If you should have URGENT concerns during the day, please call and speak to the nurse or schedule a same day appointment so that we can address your concern without delay.  ? ?Thank you, again, for choosing me as your health care partner. I appreciate your trust and look forward to learning more about you.  ? ?Hyla Coard B. Sarahjane Matherly, DNP, FNP-C ? ?___________________________________________________________ ? ?Health Maintenance Recommendations ?Screening Testing ?Mammogram ?Every 1-2 years based on history and risk factors ?Starting at age 50 ?Pap Smear ?Ages 21-39 every 3 years ?Ages 30-65 every 5 years with HPV testing ?More frequent testing may be required based on results and history ?Colon Cancer Screening ?Every 1-10 years based on test performed, risk factors, and history ?Starting at age 45 ?Bone Density Screening ?Every 2-10 years based on history ?Starting at age 65 for women ?Recommendations for men differ based on medication usage, history, and risk factors ?AAA Screening ?One time ultrasound ?Men 65-75 years old who have ever smoked ?Lung Cancer Screening ?Low Dose Lung CT every 12 months ?Age 50-80 years with a 20 pack-year smoking history who still smoke or who have quit within the last 15 years ? ?Screening Labs ?Routine  Labs: Complete Blood Count (CBC), Complete Metabolic Panel (CMP), Cholesterol (Lipid Panel) ?Every 6-12 months based on history and medications ?May be recommended more frequently based on current conditions or  previous results ?Hemoglobin A1c Lab ?Every 3-12 months based on history and previous results ?Starting at age 45 or earlier with diagnosis of diabetes, high cholesterol, BMI >26, and/or risk factors ?Frequent monitoring for patients with diabetes to ensure blood sugar control ?Thyroid Panel (TSH w/ T3 & T4) ?Every 6 months based on history, symptoms, and risk factors ?May be repeated more often if on medication ?HIV ?One time testing for all patients 13 and older ?May be repeated more frequently for patients with increased risk factors or exposure ?Hepatitis C ?One time testing for all patients 18 and older ?May be repeated more frequently for patients with increased risk factors or exposure ?Gonorrhea, Chlamydia ?Every 12 months for all sexually active persons 13-24 years ?Additional monitoring may be recommended for those who are considered high risk or who have symptoms ?PSA ?Men 40-54 years old with risk factors ?Additional screening may be recommended from age 55-69 based on risk factors, symptoms, and history ? ?Vaccine Recommendations ?Tetanus Booster ?All adults every 10 years ?Flu Vaccine ?All patients 6 months and older every year ?COVID Vaccine ?All patients 12 years and older ?Initial dosing with booster ?May recommend additional booster based on age and health history ?HPV Vaccine ?2 doses all patients   age 9-26 ?Dosing may be considered for patients over 26 ?Shingles Vaccine (Shingrix) ?2 doses all adults 50 years and older ?Pneumonia (Pneumovax 23) ?All adults 65 years and older ?May recommend earlier dosing based on health history ?Pneumonia (Prevnar 13) ?All adults 65 years and older ?Dosed 1 year after Pneumovax 23 ?Pneumonia (Prevnar 20) ?All adults 65 years and older (adults 19-64 with certain conditions or risk factors) ?1 dose  ?For those who have no received Prevnar 13 vaccine previously ? ? ?Additional Screening, Testing, and Vaccinations may be recommended on an individualized basis based on  family history, health history, risk factors, and/or exposure.  ?__________________________________________________________ ? ?Diet Recommendations for All Patients ? ?I recommend that all patients maintain a diet

## 2021-07-07 NOTE — Progress Notes (Signed)
? ?New Patient Office Visit ? ?Subjective   ? ?Patient ID: Alexandra Barrett, female    DOB: [birth year]  Age: 46 y.o. MRN: [id number] ? ?CC: establish care, headaches,  ? ? ?HPI ?Alexandra Barrett presents to establish care. ? ? ?Patient reports she has been experiencing fatigue and headaches.  ? ?Headaches: She reports that she tends to only have bad headaches around her period. Reports that she is taking birth control and takes the placebos in order to have a period. States she had full neuro workup regarding the headaches and it was unremarkable including MRI. She believes they are hormone related. She is planning to discuss with an OBGYN when she gets reestablished with someone new as her OBGYN is retiring. States her paps/mammograms are up to date.  ? ?FATIGUE ?Duration:   2 years, worse during period/PMS ?Severity: moderate  ?Onset: gradual ?Context when symptoms started:  unknown ?Symptoms improve with rest: no  ?Depressive symptoms: no ?Stress/anxiety: no ?Insomnia: no  ?Snoring: no ?Observed apnea by bed partner: no; reports maybe once a month since having COVID (3 years ago) she will wake up to catch her breath, very rare ?Daytime hypersomnolence:yes ?Wakes feeling refreshed: no ?History of sleep study: no ?Dysnea on exertion:  no ?Orthopnea/PND: no ?Chest pain: no ?Chronic cough: no ?Lower extremity edema: no ?Arthralgias:no ?Myalgias: no ?Weakness: no ?Weight loss: no   ?Hemoptysis: no  ?New medications: no  ?Leg swelling: no  ?PND: no  ?Melena: no  ?Adenopathy: no  ?Skin changes: no  ?Feeling depressed: no  ?Anhedonia: no  ?Altered appetite: no, thinks she has IBS - seeing GI next week  ?Thyroid dysfxn: No change in hair/nails, mood, weight gain ? ?Reports history of low B12, but hasn't had it checked in years. Just takes multivitamins  ? ? ?Outpatient Encounter Medications as of 07/07/2021  ?Medication Sig  ? cyclobenzaprine (FLEXERIL) 10 MG tablet Take 10 mg by mouth as needed for muscle spasms.  ?  FLUoxetine (PROZAC) 20 MG capsule Take 20 mg by mouth daily.  ? Multiple Vitamins-Minerals (WOMENS MULTIVITAMIN PO) Take by mouth.  ? LO LOESTRIN FE 1 MG-10 MCG / 10 MCG tablet Take 1 tablet by mouth daily.  ? ondansetron (ZOFRAN-ODT) 4 MG disintegrating tablet SMARTSIG:1 Tablet(s) By Mouth Every 12 Hours PRN  ? [DISCONTINUED] Albuterol Sulfate (PROAIR RESPICLICK) 108 (90 BASE) MCG/ACT AEPB Inhale 2 puffs into the lungs every 4 (four) hours as needed.  ? [DISCONTINUED] flunisolide (NASALIDE) 25 MCG/ACT (0.025%) SOLN Place 2 sprays into the nose 2 (two) times daily.  ? [DISCONTINUED] Lactase (DAIRY DIGEST EXTRA PO) Take by mouth 3 (three) times daily.  ? ?No facility-administered encounter medications on file as of 07/07/2021.  ? ? ?Past Medical History:  ?Diagnosis Date  ? Allergy   ? Anxiety   ? Asthma   ? Depression   ? ? ?Past Surgical History:  ?Procedure Laterality Date  ? NO PAST SURGERIES    ? ? ?Family History  ?Problem Relation Age of Onset  ? Depression Mother   ? Asthma Mother   ? Allergic rhinitis Mother   ? Hypertension Father   ? Diabetes Father   ? Arthritis Father   ? Depression Son   ? Hypertension Maternal Grandmother   ? Hyperlipidemia Maternal Grandmother   ? Heart disease Maternal Grandmother   ? Depression Maternal Grandmother   ? Cancer Maternal Grandmother   ? Arthritis Maternal Grandmother   ? Hypertension Maternal Grandfather   ? Hyperlipidemia Maternal Grandfather   ?  Heart disease Maternal Grandfather   ? Hearing loss Maternal Grandfather   ? Cancer Maternal Grandfather   ? Emphysema Maternal Grandfather   ? Cancer Paternal Grandmother   ? Early death Paternal Grandfather   ? Cancer Paternal Grandfather   ? ? ?Social History  ? ?Socioeconomic History  ? Marital status: Single  ?  Spouse name: Not on file  ? Number of children: Not on file  ? Years of education: Not on file  ? Highest education level: Not on file  ?Occupational History  ? Not on file  ?Tobacco Use  ? Smoking status: Never  ?  Smokeless tobacco: Never  ?Substance and Sexual Activity  ? Alcohol use: Never  ? Drug use: Never  ? Sexual activity: Yes  ?  Birth control/protection: Pill  ?Other Topics Concern  ? Not on file  ?Social History Narrative  ? Not on file  ? ?Social Determinants of Health  ? ?Financial Resource Strain: Not on file  ?Food Insecurity: Not on file  ?Transportation Needs: Not on file  ?Physical Activity: Not on file  ?Stress: Not on file  ?Social Connections: Not on file  ?Intimate Partner Violence: Not on file  ? ? ?ROS ?All review of systems negative except what is listed in the HPI ? ?  ? ? ?Objective   ? ?BP 98/64   Pulse 84   Ht 5\' 8"  (1.727 m)   Wt 130 lb (59 kg)   BMI 19.77 kg/m?  ? ?Physical Exam ?Vitals reviewed.  ?Constitutional:   ?   General: She is not in acute distress. ?   Appearance: Normal appearance. She is normal weight. She is not ill-appearing.  ?Cardiovascular:  ?   Rate and Rhythm: Normal rate and regular rhythm.  ?Musculoskeletal:  ?   Cervical back: Normal range of motion and neck supple. No tenderness.  ?Lymphadenopathy:  ?   Cervical: No cervical adenopathy.  ?Skin: ?   General: Skin is warm and dry.  ?Neurological:  ?   General: No focal deficit present.  ?   Mental Status: She is alert and oriented to person, place, and time. Mental status is at baseline.  ?   Motor: No weakness.  ?   Coordination: Coordination normal.  ?   Gait: Gait normal.  ?Psychiatric:     ?   Mood and Affect: Mood normal.     ?   Behavior: Behavior normal.     ?   Thought Content: Thought content normal.     ?   Judgment: Judgment normal.  ? ? ? ?  ? ?Assessment & Plan:  ? ?1. Encounter for lipid screening for cardiovascular disease ?- Lipid panel ? ?2. Chronic fatigue ?Checking labs today. Discussed sleep hygiene, healthy diet, and regular physical activity. Patient aware of signs/symptoms requiring further/urgent evaluation.  ?- CBC ?- Comprehensive metabolic panel ?- TSH ?- Vitamin B12 ?- IBC panel ? ?3.  Headaches ?Previous neuro workup negative. Since this seems to be related to when you have your periods, let's try by skipping the placebo pills. We can also consider switching birth control altogether - if this is something you are interested in, mention at your next OBGYN appointment. No red flags today. Continue healthy diet, hydration, exercise.  ?Patient aware of signs/symptoms requiring further/urgent evaluation. ? ? ? ?Return for pending results or as needed, CPE at your convenience.  ? ? , NP ? ? ?

## 2021-07-08 NOTE — Addendum Note (Signed)
Addended by: Caleen Jobs B on: 07/08/2021 12:45 PM ? ? Modules accepted: Orders ? ?

## 2021-07-08 NOTE — Progress Notes (Signed)
Your B12 is slightly low. Recommend you start taking an over-the-counter B12 supplement of 1,000 mcg daily. Let's recheck in 6 weeks.  ?Iron saturation is barely low and other iron labs are borderline - you could try taking a multivitamin iron in it, make sure you are eating an iron rich diet. We will recheck this in 6 weeks or so and if not balancing out we can start an iron supplement.  ?Other labs are stable.  ? ?Please schedule a 6-week lab appointment to recheck your iron and B12.

## 2021-07-13 ENCOUNTER — Encounter: Payer: Self-pay | Admitting: Gastroenterology

## 2021-07-13 ENCOUNTER — Ambulatory Visit: Payer: 59 | Admitting: Gastroenterology

## 2021-07-13 ENCOUNTER — Other Ambulatory Visit (INDEPENDENT_AMBULATORY_CARE_PROVIDER_SITE_OTHER): Payer: 59

## 2021-07-13 VITALS — BP 124/70 | HR 84 | Ht 68.0 in | Wt 130.4 lb

## 2021-07-13 DIAGNOSIS — E538 Deficiency of other specified B group vitamins: Secondary | ICD-10-CM | POA: Diagnosis not present

## 2021-07-13 DIAGNOSIS — E611 Iron deficiency: Secondary | ICD-10-CM

## 2021-07-13 DIAGNOSIS — K581 Irritable bowel syndrome with constipation: Secondary | ICD-10-CM

## 2021-07-13 LAB — SEDIMENTATION RATE: Sed Rate: 11 mm/hr (ref 0–20)

## 2021-07-13 LAB — HIGH SENSITIVITY CRP: CRP, High Sensitivity: 2.48 mg/L (ref 0.000–5.000)

## 2021-07-13 MED ORDER — CYANOCOBALAMIN 1000 MCG/ML IJ SOLN: 1000.0000 ug | INTRAMUSCULAR | 6 refills | Status: DC

## 2021-07-13 NOTE — Progress Notes (Signed)
? ?HPI : Alexandra Barrett is a very pleasant 46 year old female with a history of anxiety, depression and IBS who is referred to [place] by Dr. Evette Cristal for ongoing management of chronic GI symptoms.  She reports a lifelong problem with constipation and recalls undergoing a colonoscopy as a teenager due to her symptoms.  In the past year, her symptoms seem to be worsening.   ?Her symptoms consist of constipation, bloating/abdominal distention, excessive belching/flatulence and episodes of stabbing abdominal pain.  Her constipation is manifested by the passage of very small, hard stools which provide little relief.  She denies straining with bowel movements or prolonged bowel movements. She will have a large, satisfactory bowel movements maybe once a month.  She frequently sees mucus with her stools, but denies seeing any blood.  Diarrhea is never a problem for her.   ?She has brief episodes of abdominal pain which sometimes awaken her from sleep.  The pain is located in the periumbilical region and typically only lasts seconds before resolving spontaneously.  No other symptoms accompany the pain (nausea, vomiting).  She thinks the pain is associated with overeating the night before. ?She sometimes has mild pain with the passage of hard stools, but denies severe pain (pain comparable to the sensation of passing broken glass).  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 ? ?She has tried multiple laxatives including chocolate Ex-lax (caused crampy abdominal pain), psyllium (did not improve stools, causes bloating), Miralax (worked for a while, but then lost effectiveness) and glycerin suppositories (did not improve stool bulk).  She reports eating a healthy, high fiber diet and drinking lots of water daily. ?She reports having a colonoscopy 6-7 years ago which she says was unremarkable. ? ?She had  recent labs which showed a low B12 level.  She says she has had low B12 in the past and took bovine B12 pills which improved her energy level, but also made her feel anxious.  She does not know if this improved her B12 levels. ? ?She was also noted to have low iron indices, although she is not anemic.  She does not menstruate, as she takes her OCP every day.  She has not had a menstrual period in 4-5 years.   ? ?Her father and paternal grandmother had GI issues (no diagnosis) but she is not aware of any family history of celiac disease, IBD or GI malignancy. ? ?Past Medical History:  ?Diagnosis Date  ? Allergy   ? Anxiety   ? Asthma   ? Depression   ? ? ? ?Past Surgical History:  ?Procedure Laterality Date  ? NO PAST SURGERIES    ? ?Family History  ?Problem Relation Age of Onset  ? Depression Mother   ? Asthma Mother   ? Allergic rhinitis Mother   ? Hypertension Father   ? Diabetes Father   ? Arthritis Father   ? Depression Son   ? Hypertension Maternal Grandmother   ? Hyperlipidemia Maternal Grandmother   ? Heart disease Maternal Grandmother   ? Depression Maternal Grandmother   ? Cancer Maternal Grandmother   ? Arthritis Maternal Grandmother   ? Hypertension Maternal Grandfather   ? Hyperlipidemia Maternal Grandfather   ? Heart disease Maternal Grandfather   ? Hearing loss Maternal Grandfather   ? Cancer Maternal Grandfather   ? Emphysema Maternal Grandfather   ? Cancer Paternal Grandmother   ?  Early death Paternal Grandfather   ? Cancer Paternal Grandfather   ? ?Social History  ? ?Tobacco Use  ? Smoking status: Never  ? Smokeless tobacco: Never  ?Substance Use Topics  ? Alcohol use: Never  ? Drug use: Never  ? ?Current Outpatient Medications  ?Medication Sig Dispense Refill  ? cyclobenzaprine (FLEXERIL) 10 MG tablet Take 10 mg by mouth as needed for muscle spasms.    ? FLUoxetine (PROZAC) 20 MG capsule Take 20 mg by mouth daily.    ? LO LOESTRIN FE 1 MG-10 MCG / 10 MCG tablet Take 1 tablet by mouth daily.    ?  Multiple Vitamins-Minerals (WOMENS MULTIVITAMIN PO) Take by mouth.    ? ondansetron (ZOFRAN-ODT) 4 MG disintegrating tablet SMARTSIG:1 Tablet(s) By Mouth Every 12 Hours PRN    ? ?No current facility-administered medications for this visit.  ? ?No Known Allergies ? ? ?Review of Systems: ?All systems reviewed and negative except where noted in HPI.  ? ? ?No results found. ? ?Physical Exam: ?BP 124/70   Pulse 84   Ht _0  (1.727 m)   Wt 130 lb 6 oz (59.1 kg)   BMI 19.82 kg/m?  ?Constitutional: Pleasant,well-developed, Caucasian female in no acute distress. ?HEENT: Normocephalic and atraumatic. Conjunctivae are normal. No scleral icterus. ?Neck supple.  ?Cardiovascular: Normal rate, regular rhythm.  ?Pulmonary/chest: Effort normal and breath sounds normal. No wheezing, rales or rhonchi. ?Abdominal: Soft, nondistended, nontender. Bowel sounds active throughout. There are no masses palpable. No hepatomegaly. ?Extremities: no edema ?Neurological: Alert and oriented to person place and time. ?Skin: Skin is warm and dry. No rashes noted. ?Psychiatric: Normal mood and affect. Behavior is normal. ? ?CBC ?   ?Component Value Date/Time  ? WBC 6.4 07/07/2021 0922  ? RBC 4.10 07/07/2021 0922  ? HGB 12.9 07/07/2021 0922  ? HCT 38.9 07/07/2021 0922  ? PLT 394.0 07/07/2021 0922  ? MCV 95.0 07/07/2021 0922  ? MCHC 33.1 07/07/2021 0922  ? RDW 12.9 07/07/2021 0922  ? ? ?CMP  ?   ?Component Value Date/Time  ? NA 137 07/07/2021 0922  ? K 4.6 07/07/2021 0922  ? CL 101 07/07/2021 0922  ? CO2 29 07/07/2021 0922  ? GLUCOSE 79 07/07/2021 0922  ? BUN 12 07/07/2021 0922  ? CREATININE 0.72 07/07/2021 0922  ? CALCIUM 9.3 07/07/2021 0922  ? PROT 7.1 07/07/2021 0922  ? ALBUMIN 4.6 07/07/2021 0922  ? AST 18 07/07/2021 0922  ? ALT 17 07/07/2021 0922  ? ALKPHOS 43 07/07/2021 0922  ? BILITOT 0.5 07/07/2021 0922  ? ?Component Ref Range & Units 6 d ago  ?Iron 42 - 145 ug/dL 96   ?Transferrin 212.0 - 360.0 mg/dL 370.0 High    ?Saturation Ratios 20.0 -  50.0 % 18.5 Low    ?TIBC 250.0 - 450.0 mcg/dL 518.0 High    ? ?Component Ref Range & Units 6 d ago  ?Vitamin B-12 211 - 911 pg/mL 209 Low    ? ?Component Ref Range & Units 6 d ago  ?TSH 0.35 - 5.50 uIU/mL 1.66   ? ?ASSESSMENT AND PLAN: ?46 year old female with history consistent with IBS-C, with recent worsening of symptoms, primarily with very small, unsatisfactory stools, bloating/distention and episodic abdominal pain.  She has tried bulk laxatives, osmotic laxatives and stimulant laxatives without improvement in her symptoms.  I recommend she try Linzess to improve her bowel habits.  I suspect her bloating, distention and pain will also improve once she has more satisfactory  bowel movements.  Given her persistent poor stool quality, I think she would benefit from a therapeutic bowel purge.   ?Her iron indices indicate mild iron deficiency without anemia.  It is unclear how chronic this is.  This cannot be attributed to menstrual blood loss, as the patient has not had menses in for 5 years.  Patient reports having a colonoscopy in the past few years, so colon cancer seems very unlikely.  We will screen for celiac disease and H. pylori.  If negative, endoscopic evaluation may be indicated. ?Similarly, her B12 levels are low.  She does report this has been a chronic problem for her.  We will test for pernicious anemia/autoimmune gastritis and start B12 injections monthly.  We will also get ESR and CRP to screen for Crohn's disease as possible cause of iron and B12 deficiency. ? ? ?IBS-C ?- Linzess ?- Miralax therapeutic prep ? ?Iron deficiency ?- celiac panel, h. pylori ?- Obtain previous colonoscopy record ? ?B12 deficiency ?-Cyanocobalamin injections, monthly ?-Anti-parietal cell/intrinsic factor ?-CRP, ESR ? ?F/u 6-8 weeks ? ?Taevon Aschoff E. Candis Schatz, MD ?[facility] ? ?CC:  Maisie Fus, MD ? ?

## 2021-07-13 NOTE — Patient Instructions (Addendum)
Dr Tomasa Rand recommends that you complete a bowel purge (to clean out your bowels). Please do the following: ?Purchase a bottle of Miralax over the counter as well as a box of 5 mg dulcolax tablets. ?Take 4 dulcolax tablets. ?Wait 1 hour. ?You will then drink 6-8 capfuls of Miralax mixed in an adequate amount of water/juice/gatorade (you may choose which of these liquids to drink) over the next 2-3 hours. ?You should expect results within 1 to 6 hours after completing the bowel purge.  ? ?We have given you samples of Linzess 145 mcg, if these work for you contact our office for a prescription ? ?You will start monthly B12 injections ? ?We have sent the following medications to your pharmacy for you to pick up at your convenience:  Cyanocobalamin (B12) ? ?Your provider has requested that you go to the basement level for lab work before leaving today. Press "B" on the elevator. The lab is located at the first door on the left as you exit the elevator.  ? ?Due to recent changes in healthcare laws, you may see the results of your imaging and laboratory studies on MyChart before your provider has had a chance to review them.  We understand that in some cases there may be results that are confusing or concerning to you. Not all laboratory results come back in the same time frame and the provider may be waiting for multiple results in order to interpret others.  Please give [place] 48 hours in order for your provider to thoroughly review all the results before contacting the office for clarification of your results.   ? ?I appreciate the  opportunity to care for you ? ?Thank You  ? ?Scott Cunningham,MD ? ? ?

## 2021-07-14 ENCOUNTER — Encounter: Payer: Self-pay | Admitting: Gastroenterology

## 2021-07-15 ENCOUNTER — Encounter: Payer: Self-pay | Admitting: *Deleted

## 2021-07-15 LAB — H. PYLORI ANTIBODY, IGG: H Pylori IgG: NEGATIVE

## 2021-07-16 LAB — CELIAC DISEASE PANEL
(tTG) Ab, IgA: <1 U/mL
(tTG) Ab, IgG: <1 U/mL
Gliadin IgA: <1 U/mL
Gliadin IgG: <1 U/mL
Immunoglobulin A: 143 mg/dL (ref 47–310)

## 2021-07-16 LAB — INTRINSIC FACTOR ANTIBODIES: Intrinsic Factor: NEGATIVE

## 2021-07-18 LAB — ANTI-PARIETAL ANTIBODY: PARIETAL CELL AB SCREEN: POSITIVE — AB

## 2021-07-18 LAB — REFLEX PARIETAL CELL AB TITER: PARIETAL CELL AB TITER: >1:640 {titer} — ABNORMAL HIGH

## 2021-07-19 ENCOUNTER — Telehealth: Payer: Self-pay | Admitting: Gastroenterology

## 2021-07-19 ENCOUNTER — Encounter: Payer: Self-pay | Admitting: Gastroenterology

## 2021-07-19 NOTE — Telephone Encounter (Signed)
Patient called requesting her test results. Please advise  

## 2021-07-19 NOTE — Progress Notes (Signed)
Ms. Paramo,  ?Your labs for celiac disease, H. Pylori infection and inflammatory markers were all normal.   ?One of the antibodies associated with autoimmune gastritis/pernicious anemia was positive.  This can cause iron deficiency and B12 deficiency.  This blood test does not diagnose this condition, but does increase the probability of it. ?I recommend we proceed with an upper endoscopy with gastric biopsies to further evaluate for this condition.   ? ?Vaughan Basta,  ?Can you get Ms. Vanschaick set up for a routine EGD?

## 2021-07-19 NOTE — Telephone Encounter (Signed)
Pt calling for lab results. Please advise. 

## 2021-07-20 ENCOUNTER — Other Ambulatory Visit: Payer: Self-pay

## 2021-07-20 DIAGNOSIS — K581 Irritable bowel syndrome with constipation: Secondary | ICD-10-CM

## 2021-07-20 DIAGNOSIS — E611 Iron deficiency: Secondary | ICD-10-CM

## 2021-07-21 ENCOUNTER — Telehealth: Payer: Self-pay | Admitting: Gastroenterology

## 2021-07-21 NOTE — Telephone Encounter (Signed)
Inbound call from patient stating she had questions about her EGD on 5/31 at 10:00. Requesting a call back to discuss. Please advise.

## 2021-07-21 NOTE — Telephone Encounter (Signed)
Pt wanted to know how the biopsy is done when she has the EGD done and if it would be painful. Discussed with pt that she should not even be able to tell the bx has been done and that it would be looking for any abnormal cells or bacteria. Pt verbalized understanding.

## 2021-08-03 ENCOUNTER — Ambulatory Visit (AMBULATORY_SURGERY_CENTER): Payer: 59 | Admitting: Gastroenterology

## 2021-08-03 ENCOUNTER — Encounter: Payer: Self-pay | Admitting: Gastroenterology

## 2021-08-03 VITALS — BP 164/64 | HR 80 | Temp 97.8°F | Resp 13 | Ht 68.0 in | Wt 130.0 lb

## 2021-08-03 DIAGNOSIS — E538 Deficiency of other specified B group vitamins: Secondary | ICD-10-CM

## 2021-08-03 DIAGNOSIS — K317 Polyp of stomach and duodenum: Secondary | ICD-10-CM

## 2021-08-03 DIAGNOSIS — K2950 Unspecified chronic gastritis without bleeding: Secondary | ICD-10-CM

## 2021-08-03 DIAGNOSIS — E611 Iron deficiency: Secondary | ICD-10-CM | POA: Diagnosis not present

## 2021-08-03 MED ORDER — SODIUM CHLORIDE 0.9 % IV SOLN: 500.0000 mL | Freq: Once | INTRAVENOUS | Status: DC

## 2021-08-03 NOTE — Op Note (Signed)
[facility] Patient Name: Alexandra PoleShawna Barrett Procedure Date: 08/03/2021 10:09 AM MRN: [id number] Endoscopist: Alexandra PicketScott E. Barrett Randunningham , MD Age: [age] Referring MD:  Date of Birth: 07-18-1975 Gender: Female Account #: [id number] Procedure:                Upper GI endoscopy Indications:              Iron deficiency anemia, Vitamin B12 deficiency                            anemia, positive anti-parietal cell antibody Medicines:                Monitored Anesthesia Care Procedure:                Pre-Anesthesia Assessment:                           - Prior to the procedure, a History and Physical                            was performed, and patient medications and                            allergies were reviewed. The patient's tolerance of                            previous anesthesia was also reviewed. The risks                            and benefits of the procedure and the sedation                            options and risks were discussed with the patient.                            All questions were answered, and informed consent                            was obtained. Prior Anticoagulants: The patient has                            taken no previous anticoagulant or antiplatelet                            agents. ASA Grade Assessment: II - A patient with                            mild systemic disease. After reviewing the risks                            and benefits, the patient was deemed in                            satisfactory condition to undergo the procedure.  After obtaining informed consent, the endoscope was                            passed under direct vision. Throughout the                            procedure, the patient's blood pressure, pulse, and                            oxygen saturations were monitored continuously. The                            Endoscope was introduced through the mouth, and                             advanced to the third part of duodenum. The upper                            GI endoscopy was accomplished without difficulty.                            The patient tolerated the procedure well. Scope In: Scope Out: Findings:                 The examined esophagus was normal.                           A few diminutive sessile polyps were found in the                            gastric body. These polyps were removed with a cold                            biopsy forceps. Resection and retrieval were                            complete. Estimated blood loss was minimal.                           Normal mucosa was found in the entire examined                            stomach. Biopsies were taken with a cold forceps in                            the antrum and body for Helicobacter pylori testing                            and to assess for histologic evidence of                            autoimmune/atrophic gastritis.  The examined duodenum was normal. Biopsies for                            histology were taken with a cold forceps for                            evaluation of celiac disease. Estimated blood loss                            was minimal. Complications:            No immediate complications. Estimated Blood Loss:     Estimated blood loss was minimal. Impression:               - Normal esophagus.                           - A few gastric polyps. Resected and retrieved.                           - Normal mucosa was found in the entire stomach.                            Biopsied.                           - Normal examined duodenum. Biopsied. Recommendation:           - Patient has a contact number available for                            emergencies. The signs and symptoms of potential                            delayed complications were discussed with the                            patient. Return to normal activities tomorrow.                             Written discharge instructions were provided to the                            patient.                           - Resume previous diet.                           - Continue present medications.                           - Await pathology results.                           - Continue B12 and iron supplementation. Alexandra Barrett E. Barrett Rand, MD 08/03/2021 10:31:01 AM This report has been signed electronically.

## 2021-08-03 NOTE — Progress Notes (Signed)
History and Physical Interval Note:  08/03/2021 10:06 AM  Alexandra Barrett [name]  has presented today for endoscopic procedure(s), with the diagnosis of  Encounter Diagnoses  Name Primary?   Iron deficiency Yes   B12 deficiency   .  The various methods of evaluation and treatment have been discussed with the patient and/or family. After consideration of risks, benefits and other options for treatment, the patient has consented to  the endoscopic procedure(s).   The patient's history has been reviewed, patient examined, no change in status, stable for endoscopic procedure(s).  I have reviewed the patient's chart and labs.  Questions were answered to the patient's satisfaction.     Denya Buckingham E. Tomasa Rand, MD [facility]

## 2021-08-03 NOTE — Patient Instructions (Signed)
Please read handouts provided. Continue present medications. Await pathology results. Continue B12 and iron supplementation.   YOU HAD AN ENDOSCOPIC PROCEDURE TODAY AT [facility]:   Refer to the procedure report that was given to you for any specific questions about what was found during the examination.  If the procedure report does not answer your questions, please call your gastroenterologist to clarify.  If you requested that your care partner not be given the details of your procedure findings, then the procedure report has been included in a sealed envelope for you to review at your convenience later.  YOU SHOULD EXPECT: Some feelings of bloating in the abdomen. Passage of more gas than usual.  Walking can help get rid of the air that was put into your GI tract during the procedure and reduce the bloating. If you had a lower endoscopy (such as a colonoscopy or flexible sigmoidoscopy) you may notice spotting of blood in your stool or on the toilet paper. If you underwent a bowel prep for your procedure, you may not have a normal bowel movement for a few days.  Please Note:  You might notice some irritation and congestion in your nose or some drainage.  This is from the oxygen used during your procedure.  There is no need for concern and it should clear up in a day or so.  SYMPTOMS TO REPORT IMMEDIATELY:  Following upper endoscopy (EGD)  Vomiting of blood or coffee ground material  New chest pain or pain under the shoulder blades  Painful or persistently difficult swallowing  New shortness of breath  Fever of 100F or higher  Black, tarry-looking stools  For urgent or emergent issues, a gastroenterologist can be reached at any hour by calling [phone]. Do not use MyChart messaging for urgent concerns.    DIET:  We do recommend a small meal at first, but then you may proceed to your regular diet.  Drink plenty of fluids but you should avoid alcoholic beverages for  24 hours.  ACTIVITY:  You should plan to take it easy for the rest of today and you should NOT DRIVE or use heavy machinery until tomorrow (because of the sedation medicines used during the test).    FOLLOW UP: Our staff will call the number listed on your records 48-72 hours following your procedure to check on you and address any questions or concerns that you may have regarding the information given to you following your procedure. If we do not reach you, we will leave a message.  We will attempt to reach you two times.  During this call, we will ask if you have developed any symptoms of COVID 19. If you develop any symptoms (ie: fever, flu-like symptoms, shortness of breath, cough etc.) before then, please call [phone].  If you test positive for Covid 19 in the 2 weeks post procedure, please call and report this information to [place].    If any biopsies were taken you will be contacted by phone or by letter within the next 1-3 weeks.  Please call us at [phone] if you have not heard about the biopsies in 3 weeks.    SIGNATURES/CONFIDENTIALITY: You and/or your care partner have signed paperwork which will be entered into your electronic medical record.  These signatures attest to the fact that that the information above on your After Visit Summary has been reviewed and is understood.  Full responsibility of the confidentiality of this discharge information lies with you and/or  your care-partner.

## 2021-08-03 NOTE — Progress Notes (Signed)
Vitals-DT  Pt's states no medical or surgical changes since previsit or office visit.  

## 2021-08-03 NOTE — Progress Notes (Signed)
Called to room to assist during endoscopic procedure.  Patient ID and intended procedure confirmed with present staff. Received instructions for my participation in the procedure from the performing physician.  

## 2021-08-03 NOTE — Progress Notes (Signed)
Pt in recovery with monitors in place, VSS. Report given to receiving RN. Bite guard was placed with pt awake to ensure comfort. No dental or soft tissue damage noted. 

## 2021-08-16 NOTE — Progress Notes (Signed)
Ms. Santee,  The polyps resected from your stomach were benign fundic gland and hyperplastic polyps.  There was no evidence of infection with Helicobacter pylori or intestinal metaplasia/dysplasia.  These types of polyps are typically secondary to acid suppression therapy and no specific follow-up required for these small, benign polyps.  The biopsies of your stomach showed mild gastritis but there were no features of autoimmune or atrophic gastritis.  I do not think any further surveillance or repeat upper endoscopy is necessary. Please follow up with me in the office to further discuss management of your chronic GI symptoms.

## 2021-09-12 DIAGNOSIS — M9901 Segmental and somatic dysfunction of cervical region: Secondary | ICD-10-CM | POA: Diagnosis not present

## 2021-09-12 DIAGNOSIS — R519 Headache, unspecified: Secondary | ICD-10-CM | POA: Diagnosis not present

## 2021-09-12 DIAGNOSIS — M9900 Segmental and somatic dysfunction of head region: Secondary | ICD-10-CM | POA: Diagnosis not present

## 2021-09-12 DIAGNOSIS — M9903 Segmental and somatic dysfunction of lumbar region: Secondary | ICD-10-CM | POA: Diagnosis not present

## 2021-09-12 DIAGNOSIS — M542 Cervicalgia: Secondary | ICD-10-CM | POA: Diagnosis not present

## 2021-09-12 DIAGNOSIS — M7918 Myalgia, other site: Secondary | ICD-10-CM | POA: Diagnosis not present

## 2021-09-12 DIAGNOSIS — M9904 Segmental and somatic dysfunction of sacral region: Secondary | ICD-10-CM | POA: Diagnosis not present

## 2021-09-12 DIAGNOSIS — M5442 Lumbago with sciatica, left side: Secondary | ICD-10-CM | POA: Diagnosis not present

## 2021-09-14 ENCOUNTER — Ambulatory Visit: Payer: 59 | Admitting: Gastroenterology

## 2021-09-15 DIAGNOSIS — M9903 Segmental and somatic dysfunction of lumbar region: Secondary | ICD-10-CM | POA: Diagnosis not present

## 2021-09-15 DIAGNOSIS — M9904 Segmental and somatic dysfunction of sacral region: Secondary | ICD-10-CM | POA: Diagnosis not present

## 2021-09-15 DIAGNOSIS — M9900 Segmental and somatic dysfunction of head region: Secondary | ICD-10-CM | POA: Diagnosis not present

## 2021-09-15 DIAGNOSIS — M9901 Segmental and somatic dysfunction of cervical region: Secondary | ICD-10-CM | POA: Diagnosis not present

## 2021-09-15 DIAGNOSIS — M542 Cervicalgia: Secondary | ICD-10-CM | POA: Diagnosis not present

## 2021-09-15 DIAGNOSIS — R519 Headache, unspecified: Secondary | ICD-10-CM | POA: Diagnosis not present

## 2021-09-15 DIAGNOSIS — M5442 Lumbago with sciatica, left side: Secondary | ICD-10-CM | POA: Diagnosis not present

## 2021-09-15 DIAGNOSIS — M7918 Myalgia, other site: Secondary | ICD-10-CM | POA: Diagnosis not present

## 2021-10-10 DIAGNOSIS — M9900 Segmental and somatic dysfunction of head region: Secondary | ICD-10-CM | POA: Diagnosis not present

## 2021-10-10 DIAGNOSIS — M7918 Myalgia, other site: Secondary | ICD-10-CM | POA: Diagnosis not present

## 2021-10-10 DIAGNOSIS — M9904 Segmental and somatic dysfunction of sacral region: Secondary | ICD-10-CM | POA: Diagnosis not present

## 2021-10-10 DIAGNOSIS — R519 Headache, unspecified: Secondary | ICD-10-CM | POA: Diagnosis not present

## 2021-10-10 DIAGNOSIS — M9901 Segmental and somatic dysfunction of cervical region: Secondary | ICD-10-CM | POA: Diagnosis not present

## 2021-10-10 DIAGNOSIS — M542 Cervicalgia: Secondary | ICD-10-CM | POA: Diagnosis not present

## 2021-10-10 DIAGNOSIS — M5442 Lumbago with sciatica, left side: Secondary | ICD-10-CM | POA: Diagnosis not present

## 2021-10-10 DIAGNOSIS — M9903 Segmental and somatic dysfunction of lumbar region: Secondary | ICD-10-CM | POA: Diagnosis not present

## 2021-10-21 ENCOUNTER — Telehealth: Payer: Self-pay | Admitting: Gastroenterology

## 2021-10-21 ENCOUNTER — Other Ambulatory Visit: Payer: Self-pay

## 2021-10-21 MED ORDER — LINACLOTIDE 145 MCG PO CAPS: 145.0000 ug | ORAL_CAPSULE | Freq: Every day | ORAL | 3 refills | Status: DC

## 2021-10-21 NOTE — Telephone Encounter (Signed)
Inbound call from patient stating she no longer has any more samples of the Linzess. Patient is inquiring if prescription can be sent to verifying pharmacy  at [facility] located on [address]. Please give a call back if needing to further advise.  Thank you

## 2021-10-21 NOTE — Telephone Encounter (Signed)
Linzess 145 mcg has been sent to patients pharmacy.  

## 2021-10-27 DIAGNOSIS — M5442 Lumbago with sciatica, left side: Secondary | ICD-10-CM | POA: Diagnosis not present

## 2021-10-27 DIAGNOSIS — M542 Cervicalgia: Secondary | ICD-10-CM | POA: Diagnosis not present

## 2021-10-27 DIAGNOSIS — M7918 Myalgia, other site: Secondary | ICD-10-CM | POA: Diagnosis not present

## 2021-10-27 DIAGNOSIS — M9904 Segmental and somatic dysfunction of sacral region: Secondary | ICD-10-CM | POA: Diagnosis not present

## 2021-10-27 DIAGNOSIS — M9901 Segmental and somatic dysfunction of cervical region: Secondary | ICD-10-CM | POA: Diagnosis not present

## 2021-10-27 DIAGNOSIS — M9900 Segmental and somatic dysfunction of head region: Secondary | ICD-10-CM | POA: Diagnosis not present

## 2021-10-27 DIAGNOSIS — R519 Headache, unspecified: Secondary | ICD-10-CM | POA: Diagnosis not present

## 2021-10-27 DIAGNOSIS — M9903 Segmental and somatic dysfunction of lumbar region: Secondary | ICD-10-CM | POA: Diagnosis not present

## 2021-10-28 NOTE — Telephone Encounter (Signed)
Please advise of an alternative.

## 2021-10-28 NOTE — Telephone Encounter (Signed)
Patients insurance doesn't cover Linzess, pharmacy called to see if generic could be sent for fill.

## 2021-10-31 ENCOUNTER — Other Ambulatory Visit (HOSPITAL_COMMUNITY): Payer: Self-pay

## 2021-10-31 ENCOUNTER — Telehealth: Payer: Self-pay

## 2021-10-31 MED ORDER — LUBIPROSTONE 8 MCG PO CAPS: 8.0000 ug | ORAL_CAPSULE | Freq: Two times a day (BID) | ORAL | 3 refills | Status: DC

## 2021-10-31 NOTE — Telephone Encounter (Signed)
Amitiza 8 mcg sent to patient's pharmacy.

## 2021-10-31 NOTE — Telephone Encounter (Signed)
Called and advised patient that generic Amitiza has been sent to her pharmacy. She is aware to call if she has any issues with effectiveness.

## 2021-10-31 NOTE — Telephone Encounter (Signed)
Patient Advocate Encounter   Prior authorization is required for Lubiprostone capsules  Submitted: 10-31-2021 Key [id number]  Edwyna Ready, CphT

## 2021-11-01 NOTE — Telephone Encounter (Signed)
Patient Advocate Encounter  Prior Authorization for  Lubiprostone capsules has been approved.    Effective: 11/01/2021 to 11/01/2024   Edwyna Ready, CphT

## 2021-11-11 ENCOUNTER — Other Ambulatory Visit: Payer: Self-pay | Admitting: Family Medicine

## 2021-11-11 ENCOUNTER — Encounter: Payer: Self-pay | Admitting: Family Medicine

## 2021-11-11 MED ORDER — SUMATRIPTAN SUCCINATE 50 MG PO TABS: 50.0000 mg | ORAL_TABLET | ORAL | 0 refills | Status: AC | PRN

## 2021-11-23 ENCOUNTER — Encounter: Payer: Self-pay | Admitting: General Practice

## 2021-12-06 ENCOUNTER — Ambulatory Visit (INDEPENDENT_AMBULATORY_CARE_PROVIDER_SITE_OTHER): Payer: 59 | Admitting: Family

## 2021-12-06 ENCOUNTER — Other Ambulatory Visit (HOSPITAL_COMMUNITY)
Admission: RE | Admit: 2021-12-06 | Discharge: 2021-12-06 | Disposition: A | Discharge status: Home / Self Care | Payer: 59 | Source: Ambulatory Visit | Attending: Family | Admitting: Family

## 2021-12-06 ENCOUNTER — Encounter: Payer: Self-pay | Admitting: Family

## 2021-12-06 VITALS — BP 98/60 | HR 88 | Temp 98.7°F | Ht 68.0 in | Wt 130.0 lb

## 2021-12-06 DIAGNOSIS — Z113 Encounter for screening for infections with a predominantly sexual mode of transmission: Secondary | ICD-10-CM | POA: Diagnosis present

## 2021-12-06 DIAGNOSIS — R300 Dysuria: Secondary | ICD-10-CM

## 2021-12-06 DIAGNOSIS — R69 Illness, unspecified: Secondary | ICD-10-CM | POA: Diagnosis not present

## 2021-12-06 MED ORDER — FLUCONAZOLE 100 MG PO TABS: 100.0000 mg | ORAL_TABLET | Freq: Every day | ORAL | 0 refills | Status: DC

## 2021-12-06 NOTE — Progress Notes (Signed)
Alexandra Barrett is a 46 y.o. 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 1 mL 6   cyclobenzaprine (FLEXERIL) 10 MG tablet Take 10 mg by mouth as needed for muscle spasms.     FLUoxetine (PROZAC) 20 MG capsule Take 20 mg by mouth daily.     LO LOESTRIN FE 1 MG-10 MCG / 10 MCG tablet Take 1 tablet by mouth daily.     Multiple Vitamins-Minerals (WOMENS MULTIVITAMIN PO) Take by mouth.     ondansetron (ZOFRAN-ODT) 4 MG disintegrating tablet SMARTSIG:1 Tablet(s) By Mouth Every 12 Hours PRN     SUMAtriptan (IMITREX) 50 MG tablet Take 1 tablet (50 mg total) by mouth every 2 (two) hours as needed for migraine. May repeat in 2 hours if headache persists or recurs. 10 tablet 0   No current facility-administered medications for this visit.    Allergies: Patient has no known allergies.  Past Medical History:  Diagnosis Date   Allergy    Anal fissure    Anxiety    Asthma    Depression     Past Surgical History:  Procedure Laterality Date   cervical cell removal     COLONOSCOPY     EYE SURGERY     UPPER GASTROINTESTINAL ENDOSCOPY      Family History  Problem Relation Age of Onset   Depression Mother    Asthma Mother    Allergic rhinitis Mother    Hypertension Father    Arthritis Father    Colon polyps Father    Hyperlipidemia Father    Depression Father    GER disease Father    Hypertension Maternal Grandmother    Hyperlipidemia Maternal Grandmother    Heart disease Maternal Grandmother    Depression Maternal Grandmother    Colon cancer Maternal Grandmother        had a colostomy bag   Arthritis Maternal  Grandmother    Diabetes Maternal Grandmother    Hypertension Maternal Grandfather    Hyperlipidemia Maternal Grandfather    Heart disease Maternal Grandfather    Hearing loss Maternal Grandfather    Cancer Maternal Grandfather        type unknown   Emphysema Maternal Grandfather    Diabetes Maternal Grandfather    Gallbladder disease Paternal Grandmother    Early death Paternal Grandfather    Colon cancer Paternal Grandfather    Esophageal cancer Paternal Grandfather    Depression Son    Stomach cancer Neg Hx    Rectal cancer Neg Hx     Social History   Tobacco Use   Smoking status: Never   Smokeless tobacco: Never  Substance Use Topics   Alcohol use: Never    Subjective:   Requesting STD screen; concerned for vaginal discomfort/ urinary discomfort; no vaginal discharge; notes feels like it may be a yeast infection- is prone to yeast;  No vaginal discharge or pelvic pain;   LMP continual cycling    Objective:  Vitals:   12/06/21 0827  BP: 98/60  Pulse: 88  Temp: 98.7 F (37.1 C)  SpO2: 98%  Weight: 130 lb (59 kg)  Height: 5'  8" (1.727 m)    General: Well developed, well nourished, in no acute distress  Skin : Warm and dry.  Head: Normocephalic and atraumatic  Lungs: Respirations unlabored;  Neurologic: Alert and oriented; speech intact; face symmetrical; moves all extremities well; CNII-XII intact without focal deficit   Assessment:  1. Screen for STD (sexually transmitted disease)     Plan:  Will update vaginal swab and urine culture; due to history of recurrent yeast infections, will go ahead and treat with Diflucan 100 mg qd x 7 days;  Follow up to be determined;   No follow-ups on file.  No orders of the defined types were placed in this encounter.   Requested Prescriptions    No prescriptions requested or ordered in this encounter

## 2021-12-07 LAB — CERVICOVAGINAL ANCILLARY ONLY
Bacterial Vaginitis (gardnerella): NEGATIVE
Candida Glabrata: NEGATIVE
Candida Vaginitis: NEGATIVE
Chlamydia: NEGATIVE
Comment: NEGATIVE
Comment: NEGATIVE
Comment: NEGATIVE
Comment: NEGATIVE
Comment: NEGATIVE
Comment: NORMAL
Neisseria Gonorrhea: NEGATIVE
Trichomonas: NEGATIVE

## 2021-12-07 LAB — URINE CULTURE
MICRO NUMBER:: 14000558
Result:: NO GROWTH
SPECIMEN QUALITY:: ADEQUATE

## 2022-01-30 ENCOUNTER — Encounter: Payer: 59 | Admitting: Obstetrics and Gynecology

## 2022-01-31 ENCOUNTER — Encounter: Payer: Self-pay | Admitting: General Practice

## 2022-02-01 ENCOUNTER — Encounter: Payer: Self-pay | Admitting: Family Medicine

## 2022-02-01 ENCOUNTER — Ambulatory Visit (INDEPENDENT_AMBULATORY_CARE_PROVIDER_SITE_OTHER): Payer: 59 | Admitting: Family Medicine

## 2022-02-01 VITALS — BP 114/65 | HR 76 | Temp 97.9°F | Resp 16 | Ht 68.0 in | Wt 133.7 lb

## 2022-02-01 DIAGNOSIS — J029 Acute pharyngitis, unspecified: Secondary | ICD-10-CM

## 2022-02-01 DIAGNOSIS — R0989 Other specified symptoms and signs involving the circulatory and respiratory systems: Secondary | ICD-10-CM

## 2022-02-01 LAB — POCT RAPID STREP A (OFFICE): Rapid Strep A Screen: NEGATIVE

## 2022-02-01 LAB — POC COVID19 BINAXNOW: SARS Coronavirus 2 Ag: NEGATIVE

## 2022-02-01 LAB — POCT INFLUENZA A/B
Influenza A, POC: NEGATIVE
Influenza B, POC: NEGATIVE

## 2022-02-01 NOTE — Progress Notes (Signed)
Acute Office Visit  Subjective:     Patient ID: Alexandra Barrett, female    DOB: 1975/03/30, 46 y.o.   MRN: [id number]  Chief Complaint  Patient presents with   Sinus Problem    Pt is here today for possible sinus infection   Sore Throat   Influenza    Pt is here today      Patient is in today for URI symptoms.  Upper Respiratory Infection: Patient complains of symptoms of a URI. Symptoms include congestion and queasy feeling, headache, sore throat (raw feeling), rhinorrhea, post nasal drainage, chest congestion. No fevers, cough, dyspnea. Sore throat is her worst symptom.  . Onset of symptoms was 3-4 days ago, gradually worsening since that time.  She is drinking plenty of fluids. Evaluation to date: none. Treatment to date:  ibuprofen, albuterol, zinc, vitamin c/d, elderberry . Works as a Midwife so lots of potential exposures.      ROS All review of systems negative except what is listed in the HPI      Objective:    BP 114/65   Pulse 76   Temp 97.9 F (36.6 C)   Resp 16   Ht 5\' 8"  (1.727 m)   Wt 133 lb 11.2 oz (60.6 kg)   SpO2 100%   BMI 20.33 kg/m    Physical Exam Vitals reviewed.  Constitutional:      Appearance: She is well-developed.  HENT:     Head: Normocephalic and atraumatic.     Right Ear: Tympanic membrane normal.     Left Ear: Tympanic membrane normal.     Nose: No congestion or rhinorrhea.     Mouth/Throat:     Mouth: Mucous membranes are moist.     Pharynx: Posterior oropharyngeal erythema present. No pharyngeal swelling, oropharyngeal exudate or uvula swelling.     Tonsils: No tonsillar exudate or tonsillar abscesses. 0 on the right. 0 on the left.  Eyes:     Conjunctiva/sclera: Conjunctivae normal.  Cardiovascular:     Rate and Rhythm: Normal rate and regular rhythm.  Pulmonary:     Effort: Pulmonary effort is normal.     Breath sounds: Normal breath sounds.  Musculoskeletal:     Cervical back: Normal range of  motion and neck supple.  Skin:    General: Skin is warm and dry.  Neurological:     General: No focal deficit present.     Mental Status: She is alert and oriented to person, place, and time.  Psychiatric:        Mood and Affect: Mood normal.        Behavior: Behavior normal.        Results for orders placed or performed in visit on 02/01/22  POCT Influenza A/B  Result Value Ref Range   Influenza A, POC Negative Negative   Influenza B, POC Negative Negative  POC COVID-19  Result Value Ref Range   SARS Coronavirus 2 Ag Negative Negative  POCT rapid strep A  Result Value Ref Range   Rapid Strep A Screen Negative Negative        Assessment & Plan:   Problem List Items Addressed This Visit   None Visit Diagnoses     Chest congestion    -  Primary   Sore throat     Flu, COVID, and Strep are negative.  Continue supportive measures including rest, hydration, humidifier use, steam showers, warm compresses to sinuses, warm liquids with lemon and honey, and over-the-counter  cough, cold, and analgesics as needed.  Please contact office for follow-up if symptoms do not improve or worsen. Seek emergency care if symptoms become severe.    Relevant Orders   POCT Influenza A/B (Completed)   POC COVID-19 (Completed)   POCT rapid strep A (Completed)       No orders of the defined types were placed in this encounter.   Return if symptoms worsen or fail to improve.  Clayborne Dana, NP

## 2022-02-01 NOTE — Patient Instructions (Signed)
Flu, COVID, and Strep are negative.  Continue supportive measures including rest, hydration, humidifier use, steam showers, warm compresses to sinuses, warm liquids with lemon and honey, and over-the-counter cough, cold, and analgesics as needed.  Please contact office for follow-up if symptoms do not improve or worsen. Seek emergency care if symptoms become severe.   The following information is provided as a Counsellor for ADULT patients only and does NOT take into account PREGNANCY, ALLERGIES, LIVER CONDITIONS, KIDNEY CONDITIONS, GASTROINTESTINAL CONDITIONS, OR PRESCRIPTION MEDICATION INTERACTIONS. Please be sure to ask your provider if the following are safe to take with your specific medical history, conditions, or current medication regimen if you are unsure.   Adult Basic Symptom Management  Congestion: Guaifenesin (Mucinex)- follow directions on packaging with a maximum dose of 2400mg  in a 24 hour period.  Pain/Fever: Ibuprofen 200mg  - 400mg  every 4-6 hours as needed (MAX 1200mg  in a 24 hour period) Pain/Fever: Tylenol 500mg  -1000mg  every 6-8 hours as needed (MAX 3000mg  in a 24 hour period)  Cough: Dextromethorphan (Delsym)- follow directions on packing with a maximum dose of 120mg  in a 24 hour period.  Nasal Stuffiness: Saline nasal spray and/or Nettie Pot with sterile saline solution  Runny Nose: Fluticasone nasal spray (Flonase) OR Mometasone nasal spray (Nasonex) OR Triamcinolone Acetonide nasal spray (Nasacort)- follow directions on the packaging  Pain/Pressure: Warm washcloth to the face  Sore Throat: Warm salt water gargles  If you have allergies, you may also consider taking an oral antihistamine (like Zyrtec or Claritin) as these may also help with your symptoms.  **Many medications will have more than one ingredient, be sure you are reading the packaging carefully and not taking more than one dose of the same kind of medication at the same time or too close  together. It is OK to use formulas that have all of the ingredients you want, but do not take them in a combined medication and as separate dose too close together. If you have any questions, the pharmacist will be happy to help you decide what is safe.

## 2022-02-03 ENCOUNTER — Telehealth: Payer: Self-pay | Admitting: Family Medicine

## 2022-02-03 ENCOUNTER — Ambulatory Visit
Admission: EM | Admit: 2022-02-03 | Discharge: 2022-02-03 | Disposition: A | Discharge status: Home / Self Care | Payer: 59

## 2022-02-03 DIAGNOSIS — J45998 Other asthma: Secondary | ICD-10-CM

## 2022-02-03 DIAGNOSIS — J45990 Exercise induced bronchospasm: Secondary | ICD-10-CM | POA: Diagnosis not present

## 2022-02-03 MED ORDER — ALBUTEROL SULFATE HFA 108 (90 BASE) MCG/ACT IN AERS: 2.0000 | INHALATION_SPRAY | Freq: Four times a day (QID) | RESPIRATORY_TRACT | 2 refills | Status: AC | PRN

## 2022-02-03 MED ORDER — MONTELUKAST SODIUM 10 MG PO TABS: 10.0000 mg | ORAL_TABLET | Freq: Every day | ORAL | 1 refills | Status: AC

## 2022-02-03 MED ORDER — METHYLPREDNISOLONE SODIUM SUCC 125 MG IJ SOLR
80.0000 mg | Freq: Once | INTRAMUSCULAR | Status: AC
  Administered 2022-02-03: 80 mg via INTRAMUSCULAR

## 2022-02-03 MED ORDER — CETIRIZINE HCL 10 MG PO TABS: 10.0000 mg | ORAL_TABLET | Freq: Every day | ORAL | 1 refills | Status: AC

## 2022-02-03 NOTE — Telephone Encounter (Signed)
Patient called to follow up on whether she could get steroid shot today. Please call to advise

## 2022-02-03 NOTE — ED Triage Notes (Signed)
Pt reports headache, shortness of breeasth when walking stairs, neck pain, body aches, swollen throat, fatigue x 1 week. Reporst after she had COVID her body needs a steroid shot to feel better. Albuterol inhaler gives no relief.   Reports negative COVID, Flu, Strep test done at her PCP.

## 2022-02-03 NOTE — ED Provider Notes (Signed)
[facility]    CSN: [id number] Arrival date & time: 02/03/22  1422    HISTORY   Chief Complaint  Patient presents with   Fatigue   Generalized Body Aches         HPI Alexandra Barrett is a pleasant, 46 y.o. female who presents to urgent care today. Patient complains of a 1 week history of headache, shortness of breath when walking up the stairs, hoarseness of voice, body aches, throat swelling/pain/dryness, increased fatigue.  Patient states she had a negative COVID-19, influenza and strep test at her primary care provider's office 2 days ago, had been experiencing the symptoms 3 to 4 days prior to her visit.  Patient states whenever she has a viral infection, she needs a steroid shot to completely recover.  Patient has an O2 saturation of 99% and otherwise normal vital signs on arrival today.  Patient states she has a history of exercise-induced asthma.  Patient states she has been using her albuterol inhaler with no relief of her symptoms.  Patient states she has not had a persistent cough, runny nose, nasal congestion, takes Benadryl for allergies when needed.  The history is provided by the patient.   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    Home Medications    Prior to Admission medications   Medication Sig Start Date End Date Taking? Authorizing Provider  ALBUTEROL IN Inhale into the lungs.   Yes [provider]  cyanocobalamin (,VITAMIN B-12,) 1000 MCG/ML injection Inject 1 mL (1,000 mcg total) into the muscle every 30  (thirty) days. 07/13/21   Jenel Lucks, MD  cyclobenzaprine (FLEXERIL) 10 MG tablet Take 10 mg by mouth as needed for muscle spasms.    [provider]  fluconazole (DIFLUCAN) 100 MG tablet Take 1 tablet (100 mg total) by mouth daily. 12/06/21   Olive Bass, FNP  FLUoxetine (PROZAC) 20 MG capsule Take 20 mg by mouth daily.    [provider]  LO LOESTRIN FE 1 MG-10 MCG / 10 MCG tablet Take 1 tablet by mouth daily. 07/04/21   [provider]  Multiple Vitamins-Minerals (WOMENS MULTIVITAMIN PO) Take by mouth.    [provider]  ondansetron (ZOFRAN-ODT) 4 MG disintegrating tablet SMARTSIG:1 Tablet(s) By Mouth Every 12 Hours PRN 03/02/21   [provider]  SUMAtriptan (IMITREX) 50 MG tablet Take 1 tablet (50 mg total) by mouth every 2 (two) hours as needed for migraine. May repeat in 2 hours if headache persists or recurs. 11/11/21   Sharlene Dory, DO    Family History Family History  Problem Relation Age of Onset   Depression Mother    Asthma Mother    Allergic rhinitis Mother    Hypertension Father    Arthritis Father    Colon polyps Father    Hyperlipidemia Father    Depression Father    GER disease Father    Hypertension Maternal  Grandmother    Hyperlipidemia Maternal Grandmother    Heart disease Maternal Grandmother    Depression Maternal Grandmother    Colon cancer Maternal Grandmother        had a colostomy bag   Arthritis Maternal Grandmother    Diabetes Maternal Grandmother    Hypertension Maternal Grandfather    Hyperlipidemia Maternal Grandfather    Heart disease Maternal Grandfather    Hearing loss Maternal Grandfather    Cancer Maternal Grandfather        type unknown   Emphysema Maternal Grandfather    Diabetes Maternal Grandfather    Gallbladder disease Paternal Grandmother    Early death Paternal Grandfather    Colon cancer Paternal Grandfather    Esophageal cancer Paternal Grandfather     Depression Son    Stomach cancer Neg Hx    Rectal cancer Neg Hx    Social History Social History   Tobacco Use   Smoking status: Never   Smokeless tobacco: Never  Vaping Use   Vaping Use: Never used  Substance Use Topics   Alcohol use: Never   Drug use: Never   Allergies   Patient has no known allergies.  Review of Systems Review of Systems Pertinent findings revealed after performing a 14 point review of systems has been noted in the history of present illness.  Physical Exam Triage Vital Signs ED Triage Vitals  Enc Vitals Group     BP 12/31/20 0827 (!) 147/82     Pulse Rate 12/31/20 0827 72     Resp 12/31/20 0827 18     Temp 12/31/20 0827 98.3 F (36.8 C)     Temp Source 12/31/20 0827 Oral     SpO2 12/31/20 0827 98 %     Weight --      Height --      Head Circumference --      Peak Flow --      Pain Score 12/31/20 0826 5     Pain Loc --      Pain Edu? --      Excl. in GC? --   No data found.  Updated Vital Signs BP 113/75 (BP Location: Left Arm)   Pulse 95   Temp 97.9 F (36.6 C) (Oral)   Resp 18   SpO2 99%   Physical Exam Vitals and nursing note reviewed.  Constitutional:      General: She is not in acute distress.    Appearance: Normal appearance. She is not ill-appearing.  HENT:     Head: Normocephalic and atraumatic.     Salivary Glands: Right salivary gland is not diffusely enlarged or tender. Left salivary gland is not diffusely enlarged or tender.     Right Ear: Tympanic membrane, ear canal and external ear normal. No drainage. No middle ear effusion. There is no impacted cerumen. Tympanic membrane is not erythematous or bulging.     Left Ear: Tympanic membrane, ear canal and external ear normal. No drainage.  No middle ear effusion. There is no impacted cerumen. Tympanic membrane is not erythematous or bulging.     Nose: Nose normal. No nasal deformity, septal deviation, mucosal edema, congestion or rhinorrhea.     Right Turbinates: Not  enlarged, swollen or pale.     Left Turbinates: Not enlarged, swollen or pale.     Right Sinus: No maxillary sinus tenderness or frontal sinus tenderness.     Left Sinus: No maxillary sinus tenderness or frontal sinus tenderness.  Mouth/Throat:     Lips: Pink. No lesions.     Mouth: Mucous membranes are moist. No oral lesions.     Pharynx: Oropharynx is clear. Uvula midline. No posterior oropharyngeal erythema or uvula swelling.     Tonsils: No tonsillar exudate. 0 on the right. 0 on the left.  Eyes:     General: Lids are normal.        Right eye: No discharge.        Left eye: No discharge.     Extraocular Movements: Extraocular movements intact.     Conjunctiva/sclera: Conjunctivae normal.     Right eye: Right conjunctiva is not injected.     Left eye: Left conjunctiva is not injected.  Neck:     Trachea: Trachea and phonation normal.  Cardiovascular:     Rate and Rhythm: Normal rate and regular rhythm.     Pulses: Normal pulses.     Heart sounds: Normal heart sounds. No murmur heard.    No friction rub. No gallop.  Pulmonary:     Effort: Pulmonary effort is normal. No tachypnea, bradypnea, accessory muscle usage, prolonged expiration, respiratory distress or retractions.     Breath sounds: No stridor, decreased air movement or transmitted upper airway sounds. No decreased breath sounds, wheezing, rhonchi or rales.     Comments: Coarse, high-pitched breath sounds throughout upper lung fields Chest:     Chest wall: No tenderness.  Musculoskeletal:        General: Normal range of motion.     Cervical back: Normal range of motion and neck supple. Normal range of motion.  Lymphadenopathy:     Cervical: No cervical adenopathy.  Skin:    General: Skin is warm and dry.     Findings: No erythema or rash.  Neurological:     General: No focal deficit present.     Mental Status: She is alert and oriented to person, place, and time.  Psychiatric:        Mood and Affect: Mood  normal.        Behavior: Behavior normal.     Visual Acuity Right Eye Distance:   Left Eye Distance:   Bilateral Distance:    Right Eye Near:   Left Eye Near:    Bilateral Near:     UC Couse / Diagnostics / Procedures:     Radiology No results found.  Procedures Procedures (including critical care time) EKG  Pending results:  Labs Reviewed - No data to display  Medications Ordered in UC: Medications - No data to display  UC Diagnoses / Final Clinical Impressions(s)   I have reviewed the triage vital signs and the nursing notes.  Pertinent labs & imaging results that were available during my care of the patient were reviewed by me and considered in my medical decision making (see chart for details).    Final diagnoses:  None   ***  ED Prescriptions   None    PDMP not reviewed this encounter.  Disposition Upon Discharge:  Condition: stable for discharge home Home: take medications as prescribed; routine discharge instructions as discussed; follow up as advised.  Patient presented with an acute illness with associated systemic symptoms and significant discomfort requiring urgent management. In my opinion, this is a condition that a prudent lay person (someone who possesses an average knowledge of health and medicine) may potentially expect to result in complications if not addressed urgently such as respiratory distress, impairment of bodily function or dysfunction of bodily  organs.   Routine symptom specific, illness specific and/or disease specific instructions were discussed with the patient and/or caregiver at length.   As such, the patient has been evaluated and assessed, work-up was performed and treatment was provided in alignment with urgent care protocols and evidence based medicine.  Patient/parent/caregiver has been advised that the patient may require follow up for further testing and treatment if the symptoms continue in spite of treatment, as  clinically indicated and appropriate.  If the patient was tested for COVID-19, Influenza and/or RSV, then the patient/parent/guardian was advised to isolate at home pending the results of his/her diagnostic coronavirus test and potentially longer if they're positive. I have also advised pt that if his/her COVID-19 test returns positive, it's recommended to self-isolate for at least 10 days after symptoms first appeared AND until fever-free for 24 hours without fever reducer AND other symptoms have improved or resolved. Discussed self-isolation recommendations as well as instructions for household member/close contacts as per the [facility] and [place] [facility], and also gave patient the COVID packet with this information.  Patient/parent/caregiver has been advised to return to the [facility] or PCP in 3-5 days if no better; to PCP or the Emergency Department if new signs and symptoms develop, or if the current signs or symptoms continue to change or worsen for further workup, evaluation and treatment as clinically indicated and appropriate  The patient will follow up with their current PCP if and as advised. If the patient does not currently have a PCP we will assist them in obtaining one.   The patient may need specialty follow up if the symptoms continue, in spite of conservative treatment and management, for further workup, evaluation, consultation and treatment as clinically indicated and appropriate.  Patient/parent/caregiver verbalized understanding and agreement of plan as discussed.  All questions were addressed during visit.  Please see discharge instructions below for further details of plan.  Discharge Instructions: Discharge Instructions   None     This office note has been dictated using Dragon speech recognition software.  Unfortunately, this method of dictation can sometimes lead to typographical or grammatical errors.  I apologize for your inconvenience in advance if this occurs.  Please do not hesitate  to reach out to me if clarification is needed.

## 2022-02-03 NOTE — Telephone Encounter (Signed)
Patient states she is still not better and would like to get a steroid shot instead on trying more pills. She stated the pills never work and usually ends up getting the shot. Please advise.

## 2022-02-03 NOTE — Discharge Instructions (Signed)
Please read below to learn more about the medications, dosages and frequencies that I recommend to help alleviate your symptoms and to get you feeling better soon:   Solu-Medrol IM (methylprednisolone):  To quickly address your significant respiratory inflammation, you were provided with an injection of Solu-Medrol in the office today.  You should continue to feel the full benefit of the steroid for the next 4 to 6 hours.    Zyrtec (cetirizine): This is an excellent second-generation antihistamine that helps to reduce respiratory inflammatory response to environmental allergens.  In some patients, this medication can cause daytime sleepiness so I recommend that you take 1 tablet daily at bedtime.     Singulair (montelukast): This is a mast cell stabilizer that works well with antihistamines.  Mast cells are responsible for stimulating histamine production so you can imagine that if we can reduce the activity of your mast cells, then fewer histamines will be produced and inflammation caused by allergy exposure will be significantly reduced.  I recommend that you take this medication at the same time you take your antihistamine.   ProAir, Ventolin, Proventil (albuterol): This inhaled medication contains a short acting beta agonist bronchodilator.  This medication works on the smooth muscle that opens and constricts of your airways by relaxing the muscle.  The result of relaxation of the smooth muscle is increased air movement and improved work of breathing.  This is a short acting medication that can be used every 4-6 hours as needed for increased work of breathing, shortness of breath, wheezing and excessive coughing.  I have provided you with a prescription.    Advil, Motrin (ibuprofen): This is a good anti-inflammatory medication which not only addresses aches, pains but also significantly reduces soft tissue inflammation of the upper airways that causes sinus and nasal congestion as well as inflammation  of the lower airways which makes you feel like your breathing is constricted or your cough feel tight.  I recommend that you take 400 mg every 8 hours as needed.      If you find that you have not had improvement of your symptoms in the next 3 to 5 days, please follow-up with your primary care provider or return here to urgent care for repeat evaluation and further recommendations.   Thank you for visiting urgent care today.  We appreciate the opportunity to participate in your care.

## 2022-03-01 ENCOUNTER — Other Ambulatory Visit: Payer: Self-pay | Admitting: Gastroenterology

## 2022-03-02 ENCOUNTER — Other Ambulatory Visit: Payer: Self-pay

## 2022-04-19 ENCOUNTER — Other Ambulatory Visit: Payer: Self-pay | Admitting: Gastroenterology
